# Patient Record
Sex: Female | Born: 1935 | Race: White | Hispanic: No | Marital: Married | Smoking: Former smoker
Health system: Southern US, Community
[De-identification: ages and names within clinical notes are randomized; demographics above are authoritative.]

## PROBLEM LIST (undated history)

## (undated) DIAGNOSIS — D539 Nutritional anemia, unspecified: Secondary | ICD-10-CM

## (undated) DIAGNOSIS — E7800 Pure hypercholesterolemia, unspecified: Secondary | ICD-10-CM

## (undated) DIAGNOSIS — I129 Hypertensive chronic kidney disease with stage 1 through stage 4 chronic kidney disease, or unspecified chronic kidney disease: Secondary | ICD-10-CM

## (undated) DIAGNOSIS — E538 Deficiency of other specified B group vitamins: Secondary | ICD-10-CM

## (undated) DIAGNOSIS — N183 Chronic kidney disease, stage 3 unspecified: Secondary | ICD-10-CM

## (undated) DIAGNOSIS — E46 Unspecified protein-calorie malnutrition: Secondary | ICD-10-CM

## (undated) DIAGNOSIS — M810 Age-related osteoporosis without current pathological fracture: Secondary | ICD-10-CM

## (undated) DIAGNOSIS — G479 Sleep disorder, unspecified: Secondary | ICD-10-CM

## (undated) HISTORY — DX: Unspecified protein-calorie malnutrition: E46

## (undated) HISTORY — DX: Pure hypercholesterolemia, unspecified: E78.00

## (undated) HISTORY — DX: Sleep disorder, unspecified: G47.9

## (undated) HISTORY — DX: Hypertensive chronic kidney disease with stage 1 through stage 4 chronic kidney disease, or unspecified chronic kidney disease: I12.9

## (undated) HISTORY — DX: Deficiency of other specified B group vitamins: E53.8

## (undated) HISTORY — DX: Nutritional anemia, unspecified: D53.9

## (undated) HISTORY — DX: Chronic kidney disease, stage 3 unspecified: N18.30

## (undated) HISTORY — DX: Age-related osteoporosis without current pathological fracture: M81.0

---

## 1999-04-25 ENCOUNTER — Encounter: Payer: Self-pay | Admitting: General Surgery

## 1999-04-30 ENCOUNTER — Ambulatory Visit (HOSPITAL_COMMUNITY): Admission: RE | Admit: 1999-04-30 | Discharge: 1999-04-30 | Payer: Self-pay | Admitting: General Surgery

## 1999-04-30 ENCOUNTER — Encounter (INDEPENDENT_AMBULATORY_CARE_PROVIDER_SITE_OTHER): Payer: Self-pay

## 1999-10-24 ENCOUNTER — Encounter: Admission: RE | Admit: 1999-10-24 | Discharge: 1999-10-24 | Payer: Self-pay | Admitting: Family Medicine

## 1999-10-24 ENCOUNTER — Encounter: Payer: Self-pay | Admitting: Family Medicine

## 2000-10-29 ENCOUNTER — Encounter: Admission: RE | Admit: 2000-10-29 | Discharge: 2000-10-29 | Payer: Self-pay | Admitting: Family Medicine

## 2000-10-29 ENCOUNTER — Encounter: Payer: Self-pay | Admitting: Family Medicine

## 2001-01-20 ENCOUNTER — Encounter: Admission: RE | Admit: 2001-01-20 | Discharge: 2001-01-20 | Payer: Self-pay | Admitting: Family Medicine

## 2001-01-20 ENCOUNTER — Encounter: Payer: Self-pay | Admitting: Family Medicine

## 2003-03-21 ENCOUNTER — Encounter: Admission: RE | Admit: 2003-03-21 | Discharge: 2003-03-21 | Payer: Self-pay | Admitting: Family Medicine

## 2005-10-24 ENCOUNTER — Encounter: Admission: RE | Admit: 2005-10-24 | Discharge: 2005-10-24 | Payer: Self-pay | Admitting: Family Medicine

## 2006-10-30 ENCOUNTER — Encounter: Admission: RE | Admit: 2006-10-30 | Discharge: 2006-10-30 | Payer: Self-pay | Admitting: Family Medicine

## 2007-12-14 ENCOUNTER — Encounter: Admission: RE | Admit: 2007-12-14 | Discharge: 2007-12-14 | Payer: Self-pay | Admitting: Family Medicine

## 2007-12-22 ENCOUNTER — Encounter: Admission: RE | Admit: 2007-12-22 | Discharge: 2007-12-22 | Payer: Self-pay | Admitting: Family Medicine

## 2009-12-24 ENCOUNTER — Encounter: Admission: RE | Admit: 2009-12-24 | Discharge: 2009-12-24 | Payer: Self-pay | Admitting: Family Medicine

## 2011-06-30 ENCOUNTER — Other Ambulatory Visit: Payer: Self-pay | Admitting: Family Medicine

## 2011-06-30 DIAGNOSIS — Z1231 Encounter for screening mammogram for malignant neoplasm of breast: Secondary | ICD-10-CM

## 2011-07-03 ENCOUNTER — Ambulatory Visit
Admission: RE | Admit: 2011-07-03 | Discharge: 2011-07-03 | Disposition: A | Discharge status: Home / Self Care | Payer: Medicare Other | Source: Ambulatory Visit | Attending: Family Medicine | Admitting: Family Medicine

## 2011-07-03 DIAGNOSIS — Z1231 Encounter for screening mammogram for malignant neoplasm of breast: Secondary | ICD-10-CM

## 2012-08-25 ENCOUNTER — Other Ambulatory Visit: Payer: Self-pay

## 2012-08-25 DIAGNOSIS — Z1231 Encounter for screening mammogram for malignant neoplasm of breast: Secondary | ICD-10-CM

## 2012-09-20 ENCOUNTER — Ambulatory Visit
Admission: RE | Admit: 2012-09-20 | Discharge: 2012-09-20 | Disposition: A | Discharge status: Home / Self Care | Payer: Medicare Other | Source: Ambulatory Visit

## 2012-09-20 DIAGNOSIS — Z1231 Encounter for screening mammogram for malignant neoplasm of breast: Secondary | ICD-10-CM

## 2014-07-28 ENCOUNTER — Other Ambulatory Visit: Payer: Self-pay

## 2014-07-28 DIAGNOSIS — Z1231 Encounter for screening mammogram for malignant neoplasm of breast: Secondary | ICD-10-CM

## 2014-08-08 ENCOUNTER — Ambulatory Visit: Payer: Self-pay

## 2015-08-01 ENCOUNTER — Ambulatory Visit
Admission: RE | Admit: 2015-08-01 | Discharge: 2015-08-01 | Disposition: A | Discharge status: Home / Self Care | Payer: Medicare Other | Source: Ambulatory Visit

## 2015-08-01 DIAGNOSIS — Z1231 Encounter for screening mammogram for malignant neoplasm of breast: Secondary | ICD-10-CM

## 2016-06-23 ENCOUNTER — Other Ambulatory Visit: Payer: Self-pay | Admitting: Family Medicine

## 2016-06-23 DIAGNOSIS — Z1231 Encounter for screening mammogram for malignant neoplasm of breast: Secondary | ICD-10-CM

## 2016-08-01 ENCOUNTER — Ambulatory Visit
Admission: RE | Admit: 2016-08-01 | Discharge: 2016-08-01 | Disposition: A | Discharge status: Home / Self Care | Payer: Medicare Other | Source: Ambulatory Visit | Attending: Family Medicine | Admitting: Family Medicine

## 2016-08-01 DIAGNOSIS — Z1231 Encounter for screening mammogram for malignant neoplasm of breast: Secondary | ICD-10-CM

## 2016-08-04 ENCOUNTER — Other Ambulatory Visit: Payer: Self-pay | Admitting: Family Medicine

## 2016-08-04 DIAGNOSIS — R928 Other abnormal and inconclusive findings on diagnostic imaging of breast: Secondary | ICD-10-CM

## 2016-08-06 ENCOUNTER — Ambulatory Visit
Admission: RE | Admit: 2016-08-06 | Discharge: 2016-08-06 | Disposition: A | Discharge status: Home / Self Care | Payer: Medicare Other | Source: Ambulatory Visit | Attending: Family Medicine | Admitting: Family Medicine

## 2016-08-06 DIAGNOSIS — R928 Other abnormal and inconclusive findings on diagnostic imaging of breast: Secondary | ICD-10-CM

## 2017-09-11 ENCOUNTER — Other Ambulatory Visit: Payer: Self-pay | Admitting: Family Medicine

## 2017-09-11 DIAGNOSIS — Z1231 Encounter for screening mammogram for malignant neoplasm of breast: Secondary | ICD-10-CM

## 2017-10-07 ENCOUNTER — Ambulatory Visit
Admission: RE | Admit: 2017-10-07 | Discharge: 2017-10-07 | Disposition: A | Discharge status: Home / Self Care | Payer: Medicare Other | Source: Ambulatory Visit | Attending: Family Medicine | Admitting: Family Medicine

## 2017-10-07 DIAGNOSIS — Z1231 Encounter for screening mammogram for malignant neoplasm of breast: Secondary | ICD-10-CM

## 2018-08-30 ENCOUNTER — Other Ambulatory Visit: Payer: Self-pay | Admitting: Family Medicine

## 2018-08-30 DIAGNOSIS — Z1231 Encounter for screening mammogram for malignant neoplasm of breast: Secondary | ICD-10-CM

## 2018-10-15 ENCOUNTER — Ambulatory Visit: Payer: Medicare Other

## 2019-01-12 IMAGING — MG DIGITAL SCREENING BILATERAL MAMMOGRAM WITH CAD
4 series · 4 of 4 positions shown · non-contrast
Comparison: Previous exam(s).

CLINICAL DATA: Screening.

EXAM:
DIGITAL SCREENING BILATERAL MAMMOGRAM WITH CAD

[R MLO]
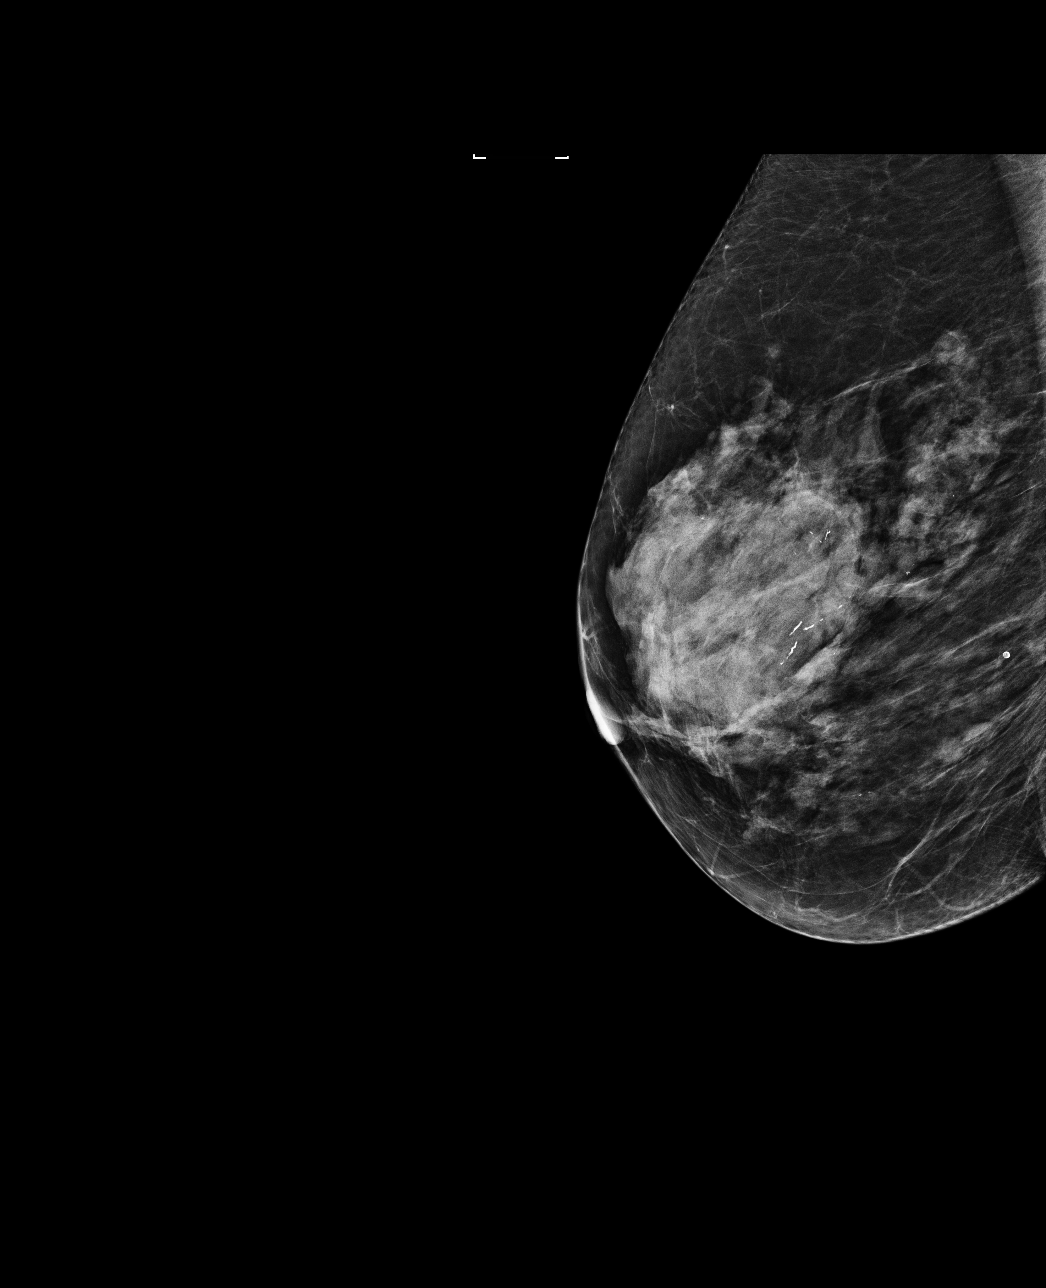

[L CC]
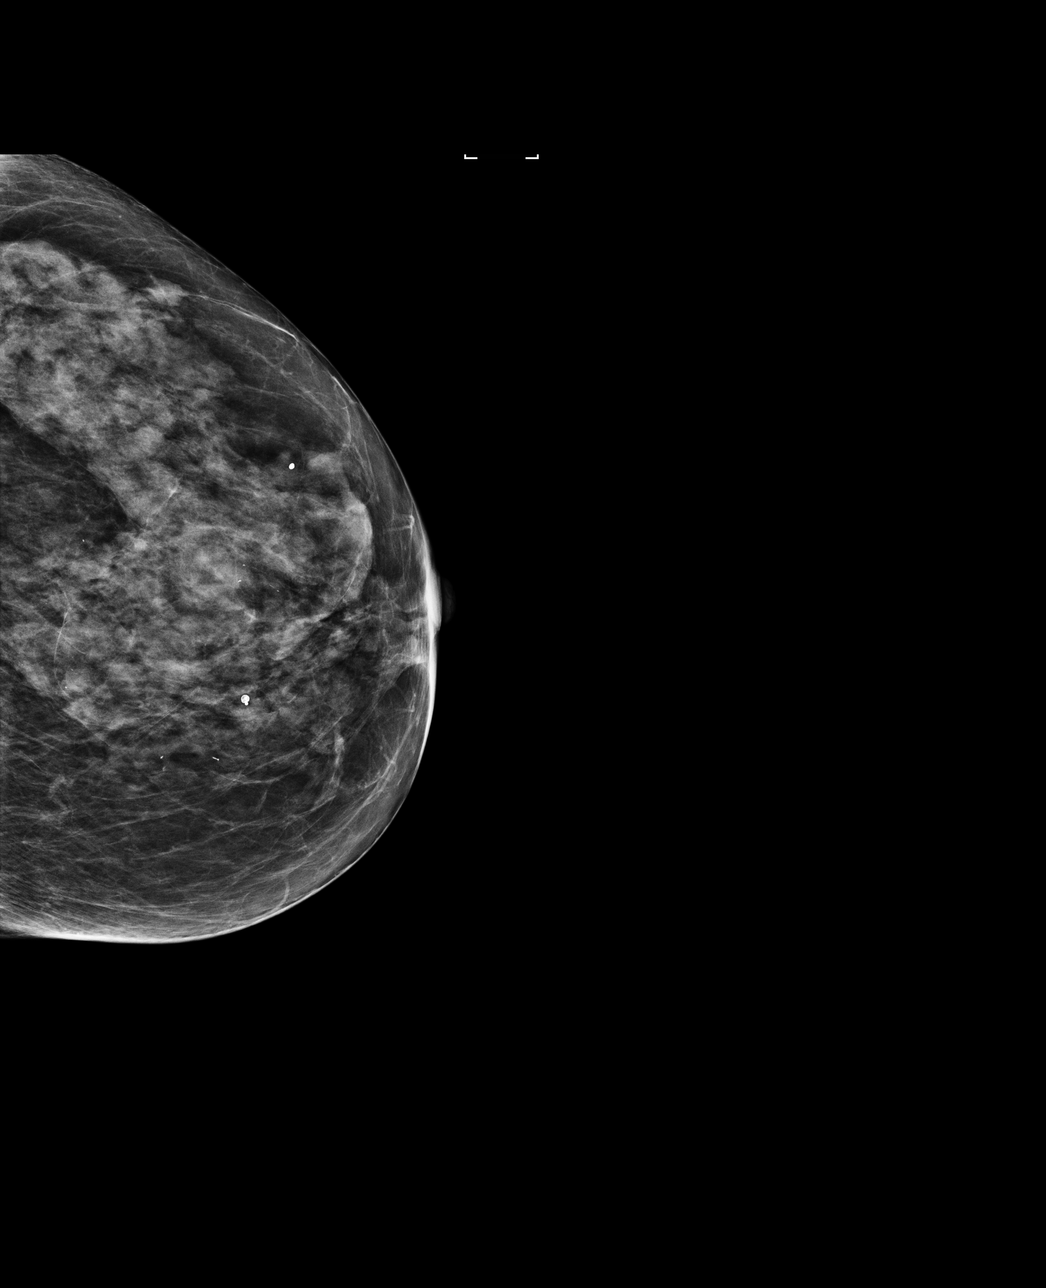

[L MLO]
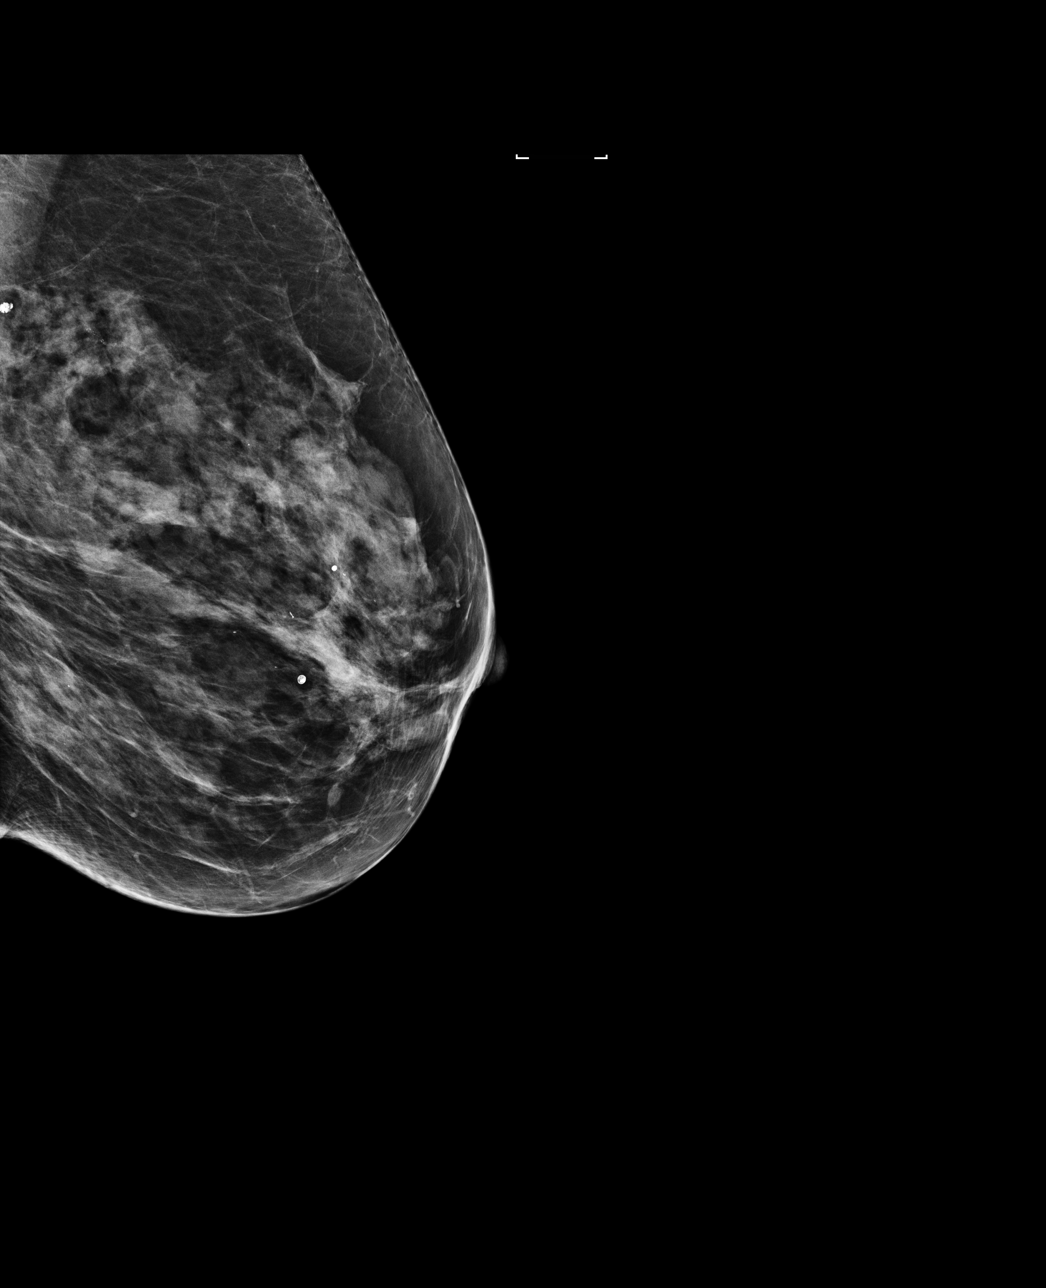

[R CC]
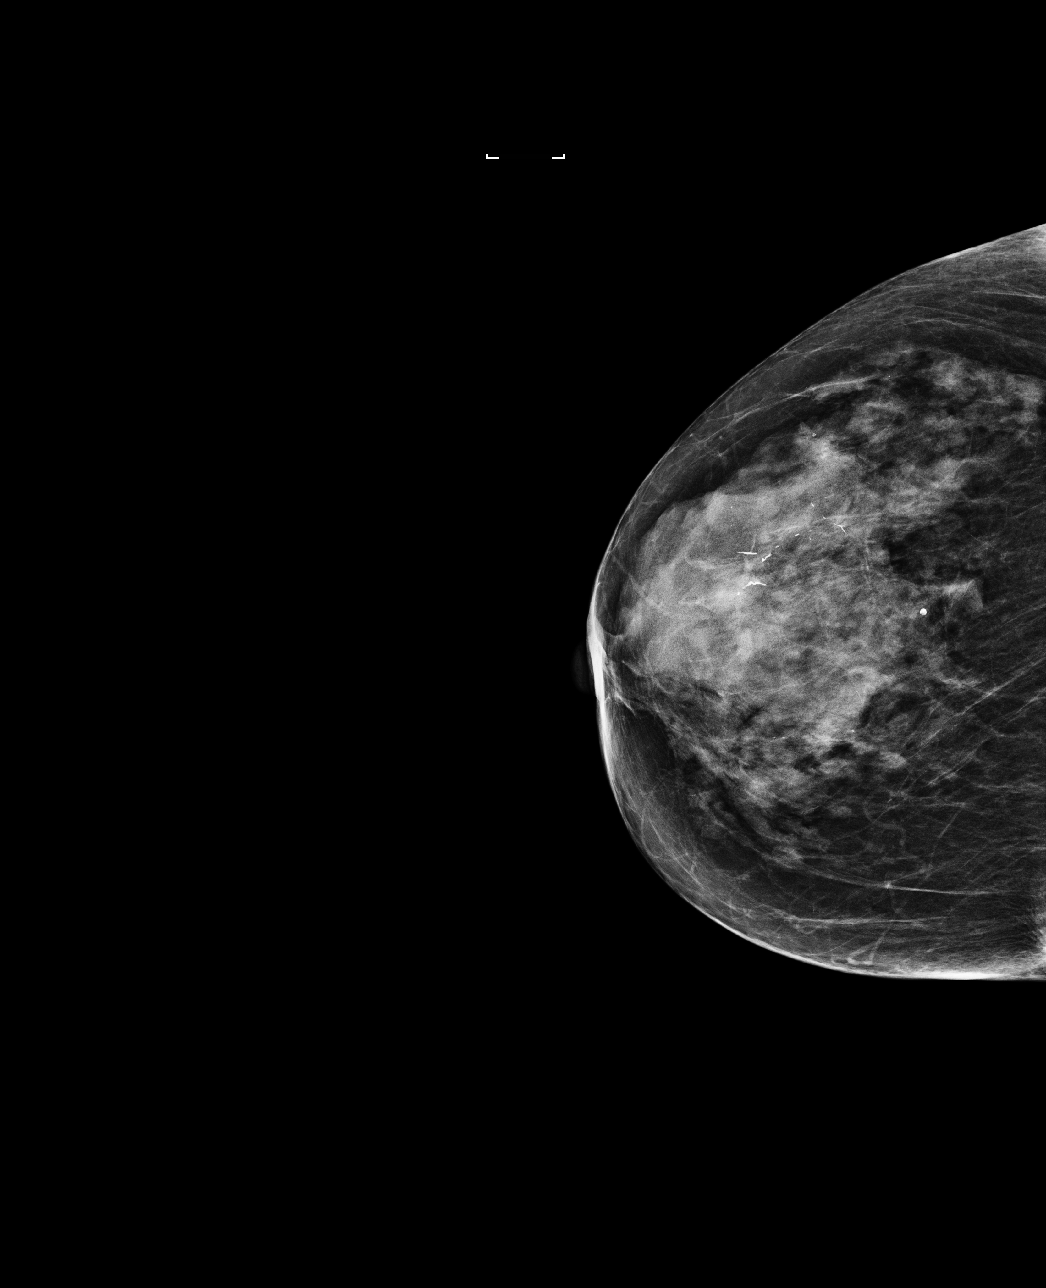

[4 of 4 positions shown; findings below may reference images not displayed]

ACR Breast Density Category c: The breast tissue is heterogeneously
dense, which may obscure small masses.
FINDINGS: There are no findings suspicious for malignancy. Images were
processed with CAD.
IMPRESSION: No mammographic evidence of malignancy. A result letter of this
screening mammogram will be mailed directly to the patient.

RECOMMENDATION:
Screening mammogram in one year. (Code:[id number])

BI-RADS CATEGORY  1: Negative.

## 2020-05-18 DIAGNOSIS — H35373 Puckering of macula, bilateral: Secondary | ICD-10-CM | POA: Diagnosis not present

## 2020-05-18 DIAGNOSIS — H26492 Other secondary cataract, left eye: Secondary | ICD-10-CM | POA: Diagnosis not present

## 2020-05-18 DIAGNOSIS — H524 Presbyopia: Secondary | ICD-10-CM | POA: Diagnosis not present

## 2020-09-20 ENCOUNTER — Other Ambulatory Visit (HOSPITAL_BASED_OUTPATIENT_CLINIC_OR_DEPARTMENT_OTHER): Payer: Self-pay

## 2020-10-31 DIAGNOSIS — E46 Unspecified protein-calorie malnutrition: Secondary | ICD-10-CM | POA: Diagnosis not present

## 2020-10-31 DIAGNOSIS — E538 Deficiency of other specified B group vitamins: Secondary | ICD-10-CM | POA: Diagnosis not present

## 2020-10-31 DIAGNOSIS — E7800 Pure hypercholesterolemia, unspecified: Secondary | ICD-10-CM | POA: Diagnosis not present

## 2020-10-31 DIAGNOSIS — G479 Sleep disorder, unspecified: Secondary | ICD-10-CM | POA: Diagnosis not present

## 2020-10-31 DIAGNOSIS — Z0000 Encounter for general adult medical examination without abnormal findings: Secondary | ICD-10-CM | POA: Diagnosis not present

## 2020-10-31 DIAGNOSIS — M810 Age-related osteoporosis without current pathological fracture: Secondary | ICD-10-CM | POA: Diagnosis not present

## 2020-10-31 DIAGNOSIS — I129 Hypertensive chronic kidney disease with stage 1 through stage 4 chronic kidney disease, or unspecified chronic kidney disease: Secondary | ICD-10-CM | POA: Diagnosis not present

## 2020-10-31 DIAGNOSIS — Z1389 Encounter for screening for other disorder: Secondary | ICD-10-CM | POA: Diagnosis not present

## 2020-10-31 DIAGNOSIS — D539 Nutritional anemia, unspecified: Secondary | ICD-10-CM | POA: Diagnosis not present

## 2020-10-31 DIAGNOSIS — Z23 Encounter for immunization: Secondary | ICD-10-CM | POA: Diagnosis not present

## 2021-03-27 ENCOUNTER — Non-Acute Institutional Stay: Payer: Medicare Other | Admitting: Internal Medicine

## 2021-03-27 ENCOUNTER — Encounter: Payer: Self-pay | Admitting: Internal Medicine

## 2021-03-27 ENCOUNTER — Other Ambulatory Visit: Payer: Self-pay

## 2021-03-27 VITALS — BP 156/80 | HR 60 | Temp 97.7°F | Ht 63.0 in | Wt 91.8 lb

## 2021-03-27 DIAGNOSIS — J310 Chronic rhinitis: Secondary | ICD-10-CM | POA: Diagnosis not present

## 2021-03-27 DIAGNOSIS — I10 Essential (primary) hypertension: Secondary | ICD-10-CM

## 2021-03-27 DIAGNOSIS — E538 Deficiency of other specified B group vitamins: Secondary | ICD-10-CM

## 2021-03-27 DIAGNOSIS — F5101 Primary insomnia: Secondary | ICD-10-CM | POA: Diagnosis not present

## 2021-03-27 MED ORDER — IPRATROPIUM BROMIDE 0.03 % NA SOLN: 2.0000 | Freq: Every day | NASAL | 3 refills | Status: DC

## 2021-03-27 NOTE — Progress Notes (Signed)
Location:  [place] of Service:  Clinic (12)  Provider:   Code Status:  Goals of Care:  Advanced Directives 03/27/2021  Does Patient Have a Medical Advance Directive? Yes  Type of Advance Directive Living will  Does patient want to make changes to medical advance directive? No - Patient declined     Chief Complaint  Patient presents with   Medical Management of Chronic Issues    Patient here today to establish care    HPI: Patient is a 88 y.o. female seen today for medical management of chronic diseases.   H/o Hypertension, Insomnia, Chronic Rhinitis  Very active Husband who is 35 years old just moved to SNF She continues to walk and drive with no issues Had no complain today  History reviewed. No pertinent past medical history.  History reviewed. No pertinent surgical history.  Allergies  Allergen Reactions   Sulfa Antibiotics     Other reaction(s): vomiting/nausea    Outpatient Encounter Medications as of 03/27/2021  Medication Sig   benazepril-hydrochlorthiazide (LOTENSIN HCT) 20-12.5 MG tablet Take 1 tablet by mouth daily.   Cholecalciferol (VITAMIN D3) 100000 UNIT/GM POWD 1 capsule   Glucosamine-Chondroitin (GLUCOSAMINE CHONDR COMPLEX PO)    temazepam (RESTORIL) 30 MG capsule Take 30 mg by mouth at bedtime.   vitamin B-12 (CYANOCOBALAMIN) 100 MCG tablet See admin instructions.   vitamin C (ASCORBIC ACID) 500 MG tablet Take 500 mg by mouth daily.   [DISCONTINUED] ipratropium (ATROVENT) 0.03 % nasal spray Place 2 sprays into both nostrils daily.   ipratropium (ATROVENT) 0.03 % nasal spray Place 2 sprays into both nostrils daily.   No facility-administered encounter medications on file as of 03/27/2021.    Review of Systems:  Review of Systems  Constitutional:  Negative for activity change and appetite change.  HENT: Negative.    Respiratory:  Negative for cough and shortness of breath.   Cardiovascular:  Negative for leg  swelling.  Gastrointestinal:  Negative for constipation.  Genitourinary: Negative.   Musculoskeletal:  Negative for arthralgias, gait problem and myalgias.  Skin: Negative.   Neurological:  Negative for dizziness and weakness.  Psychiatric/Behavioral:  Positive for sleep disturbance. Negative for confusion and dysphoric mood.    Health Maintenance  Topic Date Due   Zoster Vaccines- Shingrix (1 of 2) Never done   COVID-19 Vaccine (4 - Booster for Moderna series) 02/21/2020   TETANUS/TDAP  06/29/2021   Pneumonia Vaccine 65+ Years old  Completed   INFLUENZA VACCINE  Completed   DEXA SCAN  Completed   HPV VACCINES  Aged Out    Physical Exam: Vitals:   03/27/21 0926  BP: (!) 156/80  Pulse: 60  Temp: 97.7 F (36.5 C)  SpO2: 99%  Weight: 91 lb 12.8 oz (41.6 kg)  Height: 5\' 3"  (1.6 m)   Body mass index is 16.26 kg/m. Physical Exam Vitals reviewed.  Constitutional:      Appearance: Normal appearance.  HENT:     Head: Normocephalic.     Right Ear: Tympanic membrane normal.     Left Ear: Tympanic membrane normal.     Nose: Nose normal.     Mouth/Throat:     Mouth: Mucous membranes are moist.     Pharynx: Oropharynx is clear.  Eyes:     Pupils: Pupils are equal, round, and reactive to light.  Cardiovascular:     Rate and Rhythm: Normal rate and regular rhythm.     Pulses: Normal pulses.  Heart sounds: Normal heart sounds. No murmur heard. Pulmonary:     Effort: Pulmonary effort is normal.     Breath sounds: Normal breath sounds.  Abdominal:     General: Abdomen is flat. Bowel sounds are normal.     Palpations: Abdomen is soft.  Musculoskeletal:        General: No swelling.     Cervical back: Neck supple.  Skin:    General: Skin is warm.  Neurological:     General: No focal deficit present.     Mental Status: She is alert and oriented to person, place, and time.  Psychiatric:        Mood and Affect: Mood normal.        Thought Content: Thought content normal.     Labs reviewed: Basic Metabolic Panel: No results for input(s): NA, K, CL, CO2, GLUCOSE, BUN, CREATININE, CALCIUM, MG, PHOS, TSH in the last 8760 hours. Liver Function Tests: No results for input(s): AST, ALT, ALKPHOS, BILITOT, PROT, ALBUMIN in the last 8760 hours. No results for input(s): LIPASE, AMYLASE in the last 8760 hours. No results for input(s): AMMONIA in the last 8760 hours. CBC: No results for input(s): WBC, NEUTROABS, HGB, HCT, MCV, PLT in the last 8760 hours. Lipid Panel: No results for input(s): CHOL, HDL, LDLCALC, TRIG, CHOLHDL, LDLDIRECT in the last 8760 hours. No results found for: HGBA1C  Procedures since last visit: No results found.  Assessment/Plan 1. Primary hypertension BP slightly high  Continue same dose of Lotensin HCTZ  2. Primary insomnia On Restoril for many years Discussed the side effects   3. Chronic rhinitis Renewed Atrovent Spray  4. Vitamin B 12 deficiency Continue Supplement 5 Discussed the need for DEXA She is not intrested at this time Continue calcium and Vit d She will also bring her Advance directives next visit   Labs/tests ordered:  TSH,CBC,CMP Vit D level B 12 level before next visit Next appt:  Visit date not found

## 2021-04-02 DIAGNOSIS — F5101 Primary insomnia: Secondary | ICD-10-CM | POA: Diagnosis not present

## 2021-04-02 DIAGNOSIS — E559 Vitamin D deficiency, unspecified: Secondary | ICD-10-CM | POA: Diagnosis not present

## 2021-04-02 DIAGNOSIS — I10 Essential (primary) hypertension: Secondary | ICD-10-CM | POA: Diagnosis not present

## 2021-04-02 DIAGNOSIS — E538 Deficiency of other specified B group vitamins: Secondary | ICD-10-CM | POA: Diagnosis not present

## 2021-04-02 LAB — TSH: TSH: 3.98 (ref 0.41–5.90)

## 2021-04-02 LAB — BASIC METABOLIC PANEL
BUN: 29 — AB (ref 4–21)
Creatinine: 1 (ref 0.5–1.1)
Glucose: 91
Sodium: 148 — AB (ref 137–147)

## 2021-04-02 LAB — COMPREHENSIVE METABOLIC PANEL
Albumin: 3.8 (ref 3.5–5.0)
Globulin: 2.2

## 2021-04-02 LAB — CBC AND DIFFERENTIAL
HCT: 35 — AB (ref 36–46)
Hemoglobin: 11.2 — AB (ref 12.0–16.0)
Platelets: 202 10*3/uL (ref 150–400)

## 2021-04-02 LAB — VITAMIN B12: Vitamin B-12: 866

## 2021-04-02 LAB — CBC: RBC: 3.86 — AB (ref 3.87–5.11)

## 2021-04-08 ENCOUNTER — Other Ambulatory Visit: Payer: Self-pay

## 2021-04-08 MED ORDER — TEMAZEPAM 30 MG PO CAPS: 30.0000 mg | ORAL_CAPSULE | Freq: Every day | ORAL | 0 refills | Status: DC

## 2021-05-24 DIAGNOSIS — H524 Presbyopia: Secondary | ICD-10-CM | POA: Diagnosis not present

## 2021-05-24 DIAGNOSIS — H04123 Dry eye syndrome of bilateral lacrimal glands: Secondary | ICD-10-CM | POA: Diagnosis not present

## 2021-06-04 ENCOUNTER — Telehealth: Payer: Self-pay

## 2021-06-04 MED ORDER — NIRMATRELVIR/RITONAVIR (PAXLOVID)TABLET: 3.0000 | ORAL_TABLET | Freq: Two times a day (BID) | ORAL | 0 refills | Status: DC

## 2021-06-04 MED ORDER — NIRMATRELVIR/RITONAVIR (PAXLOVID) TABLET (RENAL DOSING): 2.0000 | ORAL_TABLET | Freq: Two times a day (BID) | ORAL | 0 refills | Status: AC

## 2021-06-04 NOTE — Telephone Encounter (Signed)
I ordered Paxlovid for her.

## 2021-06-04 NOTE — Telephone Encounter (Signed)
Notified Autumn ?

## 2021-06-04 NOTE — Telephone Encounter (Addendum)
Erika Dean states the patient was on her 5th day of isolation and requested to be tested. She tested positive. Symptoms: Watery eye and cough.  ? ?Erika Dean requesting Paxlovid order.  ?Labs abstracted. ?

## 2021-08-07 ENCOUNTER — Other Ambulatory Visit: Payer: Self-pay

## 2021-08-07 MED ORDER — TEMAZEPAM 30 MG PO CAPS: 30.0000 mg | ORAL_CAPSULE | Freq: Every day | ORAL | 5 refills | Status: DC

## 2021-08-07 NOTE — Telephone Encounter (Signed)
Patients pharmacy called requesting a refill for temazepam (controlled substance)  RX last refilled in Feb 2023, no treatment agreement on file. Notation made on pending appointment for August to sign treatment agreement.

## 2021-08-08 ENCOUNTER — Other Ambulatory Visit: Payer: Self-pay | Admitting: Internal Medicine

## 2021-08-08 NOTE — Telephone Encounter (Signed)
Patient needs refill on blood pressure medication Benazepril. Patient medication added to list December 2022. Medication not filled by PCP Mahlon Gammon, MD . Medication pend and sent to provider for approval.

## 2021-09-23 ENCOUNTER — Non-Acute Institutional Stay: Payer: Medicare Other | Admitting: Adult Health

## 2021-09-23 ENCOUNTER — Encounter: Payer: Self-pay | Admitting: Adult Health

## 2021-09-23 VITALS — BP 144/80 | HR 94 | Temp 98.6°F | Ht 63.0 in | Wt 95.8 lb

## 2021-09-23 DIAGNOSIS — J310 Chronic rhinitis: Secondary | ICD-10-CM | POA: Diagnosis not present

## 2021-09-23 DIAGNOSIS — R600 Localized edema: Secondary | ICD-10-CM

## 2021-09-23 DIAGNOSIS — I10 Essential (primary) hypertension: Secondary | ICD-10-CM | POA: Diagnosis not present

## 2021-09-23 DIAGNOSIS — M810 Age-related osteoporosis without current pathological fracture: Secondary | ICD-10-CM

## 2021-09-23 DIAGNOSIS — D539 Nutritional anemia, unspecified: Secondary | ICD-10-CM | POA: Insufficient documentation

## 2021-09-23 DIAGNOSIS — N1832 Chronic kidney disease, stage 3b: Secondary | ICD-10-CM

## 2021-09-23 DIAGNOSIS — F5101 Primary insomnia: Secondary | ICD-10-CM | POA: Diagnosis not present

## 2021-09-23 DIAGNOSIS — E538 Deficiency of other specified B group vitamins: Secondary | ICD-10-CM

## 2021-09-23 NOTE — Progress Notes (Signed)
Location:  [facility]  POS: Clinic  Provider: Fletcher Anon, ANP  Code Status:  Goals of Care:    03/27/2021    9:16 AM  Advanced Directives  Does Patient Have a Medical Advance Directive? Yes  Type of Advance Directive Living will  Does patient want to make changes to medical advance directive? No - Patient declined     Chief Complaint  Patient presents with   Medication Management    Patient returns to the clinic for her 6 month follow up and sign medication agreement.     HPI: Patient is a 88 y.o. female seen today for medical management of chronic diseases.    PMH  HTN, insomnia, chronic rhinitis, CKD, HLD,  macrocytic anemia, B12 def  Husband lives in skilled care  Hx of osteoporosis no recent DEXA. Has declined additional testing in the past, now interested.   Mammogram last normal 2019 no longer gets them due to her age.   Does not drink enough water she reports. Appetite is fair  Sleeping well on temazepam    Past Medical History:  Diagnosis Date   Age-related osteoporosis without current pathological fracture    B12 deficiency    Chronic kidney disease, stage 3 (HCC)    Hypercholesteremia    Hypertension with renal disease    Macrocytic anemia    Malnutrition, calorie (HCC)    Sleep disorder     History reviewed. No pertinent surgical history.  Allergies  Allergen Reactions   Sulfa Antibiotics     Other reaction(s): vomiting/nausea    Outpatient Encounter Medications as of 09/23/2021  Medication Sig   benazepril-hydrochlorthiazide (LOTENSIN HCT) 20-12.5 MG tablet TAKE ONE TABLET EACH DAY   Cholecalciferol (VITAMIN D3) 100000 UNIT/GM POWD 1 capsule   Glucosamine-Chondroitin (GLUCOSAMINE CHONDR COMPLEX PO)    ipratropium (ATROVENT) 0.03 % nasal spray Place 2 sprays into both nostrils daily.   temazepam (RESTORIL) 30 MG capsule Take 1 capsule (30 mg total) by mouth at bedtime.   vitamin B-12 (CYANOCOBALAMIN) 100 MCG tablet See admin  instructions.   vitamin C (ASCORBIC ACID) 500 MG tablet Take 500 mg by mouth daily.   No facility-administered encounter medications on file as of 09/23/2021.    Review of Systems:  Review of Systems  Constitutional:  Negative for activity change, appetite change, chills, diaphoresis, fatigue, fever and unexpected weight change.  HENT:  Negative for congestion.   Respiratory:  Negative for cough, shortness of breath and wheezing.   Cardiovascular:  Positive for leg swelling. Negative for chest pain and palpitations.  Gastrointestinal:  Negative for abdominal distention, abdominal pain, constipation and diarrhea.  Genitourinary:  Negative for difficulty urinating and dysuria.  Musculoskeletal:  Negative for arthralgias, back pain, gait problem, joint swelling and myalgias.  Neurological:  Negative for dizziness, tremors, seizures, syncope, facial asymmetry, speech difficulty, weakness, light-headedness, numbness and headaches.  Psychiatric/Behavioral:  Negative for agitation, behavioral problems and confusion.     Health Maintenance  Topic Date Due   Zoster Vaccines- Shingrix (1 of 2) Never done   COVID-19 Vaccine (4 - Moderna series) 02/21/2020   TETANUS/TDAP  06/29/2021   INFLUENZA VACCINE  09/10/2021   Pneumonia Vaccine 60+ Years old  Completed   DEXA SCAN  Completed   HPV VACCINES  Aged Out    Physical Exam: Vitals:   09/23/21 1407 09/23/21 1445  BP: (!) 168/88 (!) 144/80  Pulse: 94   Temp: 98.6 F (37 C)   SpO2: 92%   Weight: 95 lb  12.8 oz (43.5 kg)   Height: 5\' 3"  (1.6 m)    Body mass index is 16.97 kg/m. Physical Exam Vitals and nursing note reviewed.  Constitutional:      General: She is not in acute distress.    Appearance: She is not diaphoretic.  HENT:     Head: Normocephalic and atraumatic.     Right Ear: Tympanic membrane and ear canal normal.     Left Ear: Tympanic membrane and ear canal normal.     Nose: Nose normal.     Mouth/Throat:     Mouth:  Mucous membranes are moist.     Pharynx: Oropharynx is clear.  Eyes:     Conjunctiva/sclera: Conjunctivae normal.     Pupils: Pupils are equal, round, and reactive to light.  Neck:     Vascular: No JVD.  Cardiovascular:     Rate and Rhythm: Normal rate and regular rhythm.     Heart sounds: No murmur heard. Pulmonary:     Effort: Pulmonary effort is normal. No respiratory distress.     Breath sounds: Normal breath sounds. No wheezing.  Abdominal:     General: Bowel sounds are normal. There is no distension.     Palpations: Abdomen is soft.     Tenderness: There is no abdominal tenderness.  Musculoskeletal:     Comments: BLE edema +1 with varicose veins.   Skin:    General: Skin is warm and dry.  Neurological:     Mental Status: She is alert and oriented to person, place, and time.  Psychiatric:        Mood and Affect: Mood normal.     Labs reviewed: Basic Metabolic Panel: Recent Labs    04/02/21 0000  NA 148*  K 4.5  CL 110*  CO2 26*  BUN 29*  CREATININE 1.0  CALCIUM 9.5  TSH 3.98   Liver Function Tests: Recent Labs    04/02/21 0000  AST 14  ALT 12  ALKPHOS 92  ALBUMIN 3.8   No results for input(s): "LIPASE", "AMYLASE" in the last 8760 hours. No results for input(s): "AMMONIA" in the last 8760 hours. CBC: Recent Labs    04/02/21 0000  WBC 4.2  HGB 11.2*  HCT 35*  PLT 202   Lipid Panel: No results for input(s): "CHOL", "HDL", "LDLCALC", "TRIG", "CHOLHDL", "LDLDIRECT" in the last 8760 hours. No results found for: "HGBA1C"  Procedures since last visit: No results found.  Assessment/Plan   1. Age-related osteoporosis without current pathological fracture  - DG Bone Density; Future  2. Primary hypertension Recheck at goal <150/90 Continue current meds, may need to adjust off diuretic if NA elevated.   3. Localized edema Recommend low salt diet Compression hose  4. Stage 3b chronic kidney disease (HCC) Continue to periodically monitor BMP  and avoid nephrotoxic agents Hydrate   5. Primary insomnia Continue temazepam  6. Chronic rhinitis Continue atrovent   7. Macrocytic anemia Lab Results  Component Value Date   HGB 11.2 (A) 04/02/2021   Lab Results  Component Value Date   VITAMINB12 866 04/02/2021    Labs/tests ordered:  * No order type specified * BMP CBC this week   Next appt: 6 months with Dr 04/04/2021    Total time Chales Abrahams:  time greater than 50% of total time spent doing pt counseling and coordination of care      Recommend tetanus vaccine and shingles.

## 2021-09-23 NOTE — Patient Instructions (Signed)
Recommend tetanus vaccine and shingles.

## 2021-09-26 DIAGNOSIS — N1832 Chronic kidney disease, stage 3b: Secondary | ICD-10-CM | POA: Diagnosis not present

## 2021-09-26 DIAGNOSIS — R600 Localized edema: Secondary | ICD-10-CM | POA: Diagnosis not present

## 2021-09-26 DIAGNOSIS — I10 Essential (primary) hypertension: Secondary | ICD-10-CM | POA: Diagnosis not present

## 2021-09-26 LAB — BASIC METABOLIC PANEL
BUN: 26 — AB (ref 4–21)
CO2: 21 (ref 13–22)
Chloride: 105 (ref 99–108)
Creatinine: 1.1 (ref 0.5–1.1)
Glucose: 98
Potassium: 4.7 mEq/L (ref 3.5–5.1)
Sodium: 142 (ref 137–147)

## 2021-09-26 LAB — COMPREHENSIVE METABOLIC PANEL
Calcium: 9.2 (ref 8.7–10.7)
eGFR: 49

## 2021-09-27 ENCOUNTER — Encounter: Payer: Self-pay | Admitting: Adult Health

## 2022-02-25 ENCOUNTER — Non-Acute Institutional Stay: Payer: Medicare Other | Admitting: Internal Medicine

## 2022-02-25 ENCOUNTER — Encounter: Payer: Self-pay | Admitting: Internal Medicine

## 2022-02-25 VITALS — BP 116/70 | HR 80 | Temp 97.8°F | Resp 18 | Ht 63.0 in | Wt 88.8 lb

## 2022-02-25 DIAGNOSIS — N1832 Chronic kidney disease, stage 3b: Secondary | ICD-10-CM | POA: Diagnosis not present

## 2022-02-25 DIAGNOSIS — J209 Acute bronchitis, unspecified: Secondary | ICD-10-CM

## 2022-02-25 MED ORDER — PREDNISONE 20 MG PO TABS: 20.0000 mg | ORAL_TABLET | Freq: Every day | ORAL | 0 refills | Status: DC

## 2022-02-25 NOTE — Patient Instructions (Addendum)
Stop Aleve at night Take Tylenol if needed Will Order Xray through [facility] Can take Mucinex 1-2 tablet a day Prednisone one tablet a day with Breakfast

## 2022-02-26 ENCOUNTER — Other Ambulatory Visit: Payer: Self-pay | Admitting: Orthopedic Surgery

## 2022-02-26 DIAGNOSIS — J90 Pleural effusion, not elsewhere classified: Secondary | ICD-10-CM

## 2022-02-26 DIAGNOSIS — J189 Pneumonia, unspecified organism: Secondary | ICD-10-CM

## 2022-02-26 MED ORDER — DOXYCYCLINE HYCLATE 100 MG PO TABS: 100.0000 mg | ORAL_TABLET | Freq: Two times a day (BID) | ORAL | 0 refills | Status: AC

## 2022-02-26 MED ORDER — FUROSEMIDE 20 MG PO TABS: 20.0000 mg | ORAL_TABLET | Freq: Every morning | ORAL | 0 refills | Status: DC

## 2022-02-26 NOTE — Progress Notes (Signed)
CXR reveals pulmonary infiltrate to left lower lung base and mild left pleural effusion. [facility] nurse contacted with directions. Prescription for doxycycline and furosemide sent to pharmacy.

## 2022-02-26 NOTE — Progress Notes (Signed)
Location: [place] of Service:  Clinic (12)  Provider:   Code Status:  Goals of Care:     02/25/2022    1:09 PM  Advanced Directives  Does Patient Have a Medical Advance Directive? Yes  Type of Paramedic of [place];Living will;Out of facility DNR (pink MOST or yellow form)  Does patient want to make changes to medical advance directive? No - Patient declined  Copy of [place] in Chart? No - copy requested     Chief Complaint  Patient presents with   Acute Visit    Patient states she is here for a cough    HPI: Patient is a 89 y.o. female seen today for an acute visit for Cough Runny nose   Patient lives in [place] in [place] with c/o Cough and Runny nose for past few days Was tested by [facility] nurse for Flu and Covid and was negative She denies fever Chest pain  or SOB She did take full box of Mucinex to help her cough Also was taking Flonase to help her Runny nose Coughing up mostly clear mucus sometimes it is thick  H/o Hypertension, Insomnia, Chronic Rhinitis  Recent loss of her husband who was 72 years old. Married for 33 years  Past Medical History:  Diagnosis Date   Age-related osteoporosis without current pathological fracture    B12 deficiency    Chronic kidney disease, stage 3 (HCC)    Hypercholesteremia    Hypertension with renal disease    Macrocytic anemia    Malnutrition, calorie ([place])    Sleep disorder     History reviewed. No pertinent surgical history.  Allergies  Allergen Reactions   Sulfa Antibiotics     Other reaction(s): vomiting/nausea    Outpatient Encounter Medications as of 02/25/2022  Medication Sig   benazepril-hydrochlorthiazide (LOTENSIN HCT) 20-12.5 MG tablet TAKE ONE TABLET EACH DAY   Cholecalciferol (VITAMIN D3) 100000 UNIT/GM POWD 1 capsule   Glucosamine-Chondroitin (GLUCOSAMINE CHONDR COMPLEX PO)    ipratropium (ATROVENT) 0.03 % nasal spray Place 2  sprays into both nostrils daily.   predniSONE (DELTASONE) 20 MG tablet Take 1 tablet (20 mg total) by mouth daily with breakfast.   temazepam (RESTORIL) 30 MG capsule Take 1 capsule (30 mg total) by mouth at bedtime.   vitamin B-12 (CYANOCOBALAMIN) 100 MCG tablet See admin instructions.   vitamin C (ASCORBIC ACID) 500 MG tablet Take 500 mg by mouth daily.   No facility-administered encounter medications on file as of 02/25/2022.    Review of Systems:  Review of Systems  Constitutional:  Positive for appetite change. Negative for activity change.  HENT:  Positive for postnasal drip and rhinorrhea.   Respiratory:  Positive for cough. Negative for shortness of breath.   Cardiovascular:  Negative for leg swelling.  Gastrointestinal:  Negative for constipation.  Genitourinary: Negative.   Musculoskeletal:  Negative for arthralgias, gait problem and myalgias.  Skin: Negative.   Neurological:  Negative for dizziness and weakness.  Psychiatric/Behavioral:  Negative for confusion, dysphoric mood and sleep disturbance.     Health Maintenance  Topic Date Due   DTaP/Tdap/Td (3 - Td or Tdap) 06/29/2021   Medicare Annual Wellness (AWV)  10/31/2021   COVID-19 Vaccine (5 - 2023-24 season) 03/13/2022 (Originally 02/05/2022)   Zoster Vaccines- Shingrix (1 of 2) 05/27/2022 (Originally 01/18/1977)   Pneumonia Vaccine 44+ Years old  Completed   INFLUENZA VACCINE  Completed   DEXA SCAN  Completed   HPV VACCINES  Aged Out    Physical Exam: Vitals:   02/25/22 1307  BP: 116/70  Pulse: 80  Resp: 18  Temp: 97.8 F (36.6 C)  TempSrc: Temporal  SpO2: 99%  Weight: 88 lb 12.8 oz (40.3 kg)  Height: 5\' 3"  (1.6 m)   Body mass index is 15.73 kg/m. Physical Exam Vitals reviewed.  Constitutional:      Appearance: Normal appearance.  HENT:     Head: Normocephalic.     Nose: Nose normal.     Mouth/Throat:     Mouth: Mucous membranes are moist.     Pharynx: Oropharynx is clear.  Eyes:     Pupils:  Pupils are equal, round, and reactive to light.  Cardiovascular:     Rate and Rhythm: Normal rate and regular rhythm.     Pulses: Normal pulses.     Heart sounds: Normal heart sounds. No murmur heard. Pulmonary:     Effort: Pulmonary effort is normal.     Comments: Had Bilateral Rales and Expiratory Wheezing Abdominal:     General: Abdomen is flat. Bowel sounds are normal.     Palpations: Abdomen is soft.  Musculoskeletal:        General: No swelling.     Cervical back: Neck supple.  Skin:    General: Skin is warm.  Neurological:     General: No focal deficit present.     Mental Status: She is alert and oriented to person, place, and time.  Psychiatric:        Mood and Affect: Mood normal.        Thought Content: Thought content normal.     Labs reviewed: Basic Metabolic Panel: Recent Labs    04/02/21 0000 09/26/21 0000  NA 148* 142  K 4.5 4.7  CL 110* 105  CO2 26* 21  BUN 29* 26*  CREATININE 1.0 1.1  CALCIUM 9.5 9.2  TSH 3.98  --    Liver Function Tests: Recent Labs    04/02/21 0000  AST 14  ALT 12  ALKPHOS 92  ALBUMIN 3.8   No results for input(s): "LIPASE", "AMYLASE" in the last 8760 hours. No results for input(s): "AMMONIA" in the last 8760 hours. CBC: Recent Labs    04/02/21 0000  WBC 4.2  HGB 11.2*  HCT 35*  PLT 202   Lipid Panel: No results for input(s): "CHOL", "HDL", "LDLCALC", "TRIG", "CHOLHDL", "LDLDIRECT" in the last 8760 hours. No results found for: "HGBA1C"  Procedures since last visit: No results found.  Assessment/Plan 1. Acute bronchitis, unspecified organism Chest Xray [facility]dered in [facility] Will Start her on Prednisone 20 mg QD for 7 days for [place] Can take Mucinex 1-2 times a day   2. Stage 3b chronic kidney disease ([place]) Today she told me that she has been taking aleve every night for many years Discontinue Aleve  Can take Tylenol as needed    Labs/tests ordered:  * No order type specified * Next appt:   04/02/2022

## 2022-03-04 ENCOUNTER — Other Ambulatory Visit: Payer: Self-pay | Admitting: Internal Medicine

## 2022-03-04 DIAGNOSIS — G479 Sleep disorder, unspecified: Secondary | ICD-10-CM

## 2022-04-01 ENCOUNTER — Encounter: Payer: Self-pay | Admitting: Internal Medicine

## 2022-04-01 ENCOUNTER — Other Ambulatory Visit: Payer: Self-pay | Admitting: Internal Medicine

## 2022-04-01 ENCOUNTER — Non-Acute Institutional Stay: Payer: Medicare Other | Admitting: Internal Medicine

## 2022-04-01 VITALS — BP 122/78 | HR 66 | Temp 97.9°F | Resp 16 | Ht 63.0 in | Wt 94.6 lb

## 2022-04-01 DIAGNOSIS — I7300 Raynaud's syndrome without gangrene: Secondary | ICD-10-CM

## 2022-04-01 DIAGNOSIS — N1831 Chronic kidney disease, stage 3a: Secondary | ICD-10-CM | POA: Diagnosis not present

## 2022-04-01 DIAGNOSIS — L989 Disorder of the skin and subcutaneous tissue, unspecified: Secondary | ICD-10-CM

## 2022-04-01 DIAGNOSIS — G479 Sleep disorder, unspecified: Secondary | ICD-10-CM

## 2022-04-01 DIAGNOSIS — I10 Essential (primary) hypertension: Secondary | ICD-10-CM | POA: Diagnosis not present

## 2022-04-01 DIAGNOSIS — R600 Localized edema: Secondary | ICD-10-CM

## 2022-04-01 DIAGNOSIS — J310 Chronic rhinitis: Secondary | ICD-10-CM

## 2022-04-01 DIAGNOSIS — F5101 Primary insomnia: Secondary | ICD-10-CM | POA: Diagnosis not present

## 2022-04-01 DIAGNOSIS — M810 Age-related osteoporosis without current pathological fracture: Secondary | ICD-10-CM

## 2022-04-01 DIAGNOSIS — G3184 Mild cognitive impairment, so stated: Secondary | ICD-10-CM

## 2022-04-01 NOTE — Patient Instructions (Signed)
Need TDAP and Shingrix shot from pharmacy

## 2022-04-01 NOTE — Progress Notes (Signed)
Location:  [place] of Service:  Clinic (12)  Provider:   Code Status:  Goals of Care:     04/01/2022    9:17 AM  Advanced Directives  Does Patient Have a Medical Advance Directive? Yes  Type of Paramedic of [place];Living will;Out of facility DNR (pink MOST or yellow form)  Does patient want to make changes to medical advance directive? No - Patient declined  Copy of [place] in Chart? No - copy requested     Chief Complaint  Patient presents with   Medical Management of Chronic Issues    6 month follow up    HPI: Patient is a 89 y.o. female seen today for medical management of chronic diseases.    Lives in [place] apartment Husband died recently  H/o Hypertension, Insomnia, Chronic Rhinitis   Doing well Had Pneumonia in 01/24 Symptoms now resolved  Has 2 spots in her Ears and face She is going to have appointment with her Dermatology  Also c/o LE Edema Chronic. Feels tight Gets better when she elevates her legs No SOB or cough or chest pain  Denies depression Walks and still drives Does repeat herself Daughter in town who is managing her finances now  Past Medical History:  Diagnosis Date   Age-related osteoporosis without current pathological fracture    B12 deficiency    Chronic kidney disease, stage 3 (HCC)    Hypercholesteremia    Hypertension with renal disease    Macrocytic anemia    Malnutrition, calorie ([place])    Sleep disorder     History reviewed. No pertinent surgical history.  Allergies  Allergen Reactions   Sulfa Antibiotics     Other reaction(s): vomiting/nausea    Outpatient Encounter Medications as of 04/01/2022  Medication Sig   benazepril-hydrochlorthiazide (LOTENSIN HCT) 20-12.5 MG tablet TAKE ONE TABLET EACH DAY   Cholecalciferol (VITAMIN D3) 100000 UNIT/GM POWD 1 capsule   Glucosamine-Chondroitin (GLUCOSAMINE CHONDR COMPLEX PO)    ipratropium  (ATROVENT) 0.03 % nasal spray Place 2 sprays into both nostrils daily.   temazepam (RESTORIL) 30 MG capsule TAKE ONE CAPSULE AT BEDTIME DAILY   vitamin B-12 (CYANOCOBALAMIN) 100 MCG tablet See admin instructions.   vitamin C (ASCORBIC ACID) 500 MG tablet Take 500 mg by mouth daily.   furosemide (LASIX) 20 MG tablet Take 1 tablet (20 mg total) by mouth in the morning for 3 days.   predniSONE (DELTASONE) 20 MG tablet Take 1 tablet (20 mg total) by mouth daily with breakfast. (Patient not taking: Reported on 04/01/2022)   No facility-administered encounter medications on file as of 04/01/2022.    Review of Systems:  Review of Systems  Constitutional:  Negative for activity change and appetite change.  HENT: Negative.    Respiratory:  Negative for cough and shortness of breath.   Cardiovascular:  Positive for leg swelling.  Gastrointestinal:  Negative for constipation.  Genitourinary: Negative.   Musculoskeletal:  Negative for arthralgias, gait problem and myalgias.  Skin: Negative.   Neurological:  Negative for dizziness and weakness.  Psychiatric/Behavioral:  Negative for confusion, dysphoric mood and sleep disturbance.     Health Maintenance  Topic Date Due   DTaP/Tdap/Td (3 - Td or Tdap) 06/29/2021   Medicare Annual Wellness (AWV)  10/31/2021   COVID-19 Vaccine (5 - 2023-24 season) 04/17/2022 (Originally 02/05/2022)   Zoster Vaccines- Shingrix (1 of 2) 05/27/2022 (Originally [birth year]/1979)   Pneumonia Vaccine 69+ Years old  Completed  INFLUENZA VACCINE  Completed   DEXA SCAN  Completed   HPV VACCINES  Aged Out    Physical Exam: Vitals:   04/01/22 0918  BP: 122/78  Pulse: 66  Resp: 16  Temp: 97.9 F (36.6 C)  TempSrc: Temporal  SpO2: 92%  Weight: 94 lb 9.6 oz (42.9 kg)  Height: 5' 3"$  (1.6 m)   Body mass index is 16.76 kg/m. Physical Exam Vitals reviewed.  Constitutional:      Appearance: Normal appearance.  HENT:     Head: Normocephalic.     Nose: Nose normal.      Mouth/Throat:     Mouth: Mucous membranes are moist.     Pharynx: Oropharynx is clear.  Eyes:     Pupils: Pupils are equal, round, and reactive to light.  Cardiovascular:     Rate and Rhythm: Normal rate and regular rhythm.     Pulses: Normal pulses.     Heart sounds: Normal heart sounds. No murmur heard. Pulmonary:     Effort: Pulmonary effort is normal.     Breath sounds: Normal breath sounds.  Abdominal:     General: Abdomen is flat. Bowel sounds are normal.     Palpations: Abdomen is soft.  Musculoskeletal:        General: Swelling present.     Cervical back: Neck supple.     Comments: Bilateral swelling mild Has Raynauds in her Fingers and Toes  Skin:    General: Skin is warm.  Neurological:     General: No focal deficit present.     Mental Status: She is alert and oriented to person, place, and time.  Psychiatric:        Mood and Affect: Mood normal.        Thought Content: Thought content normal.     Labs reviewed: Basic Metabolic Panel: Recent Labs    04/02/21 0000 09/26/21 0000  NA 148* 142  K 4.5 4.7  CL 110* 105  CO2 26* 21  BUN 29* 26*  CREATININE 1.0 1.1  CALCIUM 9.5 9.2  TSH 3.98  --    Liver Function Tests: Recent Labs    04/02/21 0000  AST 14  ALT 12  ALKPHOS 92  ALBUMIN 3.8   No results for input(s): "LIPASE", "AMYLASE" in the last 8760 hours. No results for input(s): "AMMONIA" in the last 8760 hours. CBC: Recent Labs    04/02/21 0000  WBC 4.2  HGB 11.2*  HCT 35*  PLT 202   Lipid Panel: No results for input(s): "CHOL", "HDL", "LDLCALC", "TRIG", "CHOLHDL", "LDLDIRECT" in the last 8760 hours. No results found for: "HGBA1C"  Procedures since last visit: No results found.  Assessment/Plan 1. Bilateral edema of lower extremity Elevate On HCTZ Also reminded her to wear her Stockings especially when cannot raise her legs  2. Stage 3a chronic kidney disease ([place]) Repeat Labs  3. Primary hypertension Good on Lotensin HCT  4.  Primary insomnia Restoril Refuses to GDR  5. Age-related osteoporosis without current pathological fracture Has DEXA order but she does not want to do it Add Calcium to her Vit D 6. Chronic rhinitis Atrovent spray  7. Skin lesion Is going to call Dermatology  8. Mild cognitive impairment Will need MMSE Schedule for AVW  9. Raynaud's phenomenon without gangrene 10 Needs TDAP and Shingrix   Labs/tests ordered:  CBC,CMP,TSH Next appt:  Visit date not found

## 2022-04-02 ENCOUNTER — Encounter: Payer: Medicare Other | Admitting: Internal Medicine

## 2022-04-02 ENCOUNTER — Other Ambulatory Visit: Payer: Self-pay | Admitting: Internal Medicine

## 2022-04-02 DIAGNOSIS — G479 Sleep disorder, unspecified: Secondary | ICD-10-CM

## 2022-04-02 NOTE — Telephone Encounter (Signed)
Duplicate

## 2022-04-17 LAB — BASIC METABOLIC PANEL
CO2: 23 — AB (ref 13–22)
Chloride: 109 — AB (ref 99–108)
Creatinine: 0.9 (ref 0.5–1.1)
Glucose: 87
Potassium: 4.7 mEq/L (ref 3.5–5.1)
Sodium: 144 (ref 137–147)

## 2022-04-17 LAB — COMPREHENSIVE METABOLIC PANEL
Albumin: 3.3 — AB (ref 3.5–5.0)
Calcium: 9.2 (ref 8.7–10.7)
Globulin: 2.2
eGFR: 62

## 2022-04-17 LAB — CBC AND DIFFERENTIAL
HCT: 32 — AB (ref 36–46)
Hemoglobin: 10 — AB (ref 12.0–16.0)
Neutrophils Absolute: 7.1
Platelets: 280 10*3/uL (ref 150–400)
WBC: 8.8

## 2022-04-17 LAB — HEPATIC FUNCTION PANEL
ALT: 13 U/L (ref 7–35)
AST: 18 (ref 13–35)
Bilirubin, Total: 0.2

## 2022-04-17 LAB — CBC: RBC: 3.63 — AB (ref 3.87–5.11)

## 2022-04-17 LAB — TSH: TSH: 3.22 (ref 0.41–5.90)

## 2022-05-02 ENCOUNTER — Other Ambulatory Visit: Payer: Self-pay | Admitting: Internal Medicine

## 2022-05-02 DIAGNOSIS — G479 Sleep disorder, unspecified: Secondary | ICD-10-CM

## 2022-06-04 ENCOUNTER — Other Ambulatory Visit: Payer: Self-pay | Admitting: Internal Medicine

## 2022-06-04 DIAGNOSIS — G479 Sleep disorder, unspecified: Secondary | ICD-10-CM

## 2022-06-09 ENCOUNTER — Encounter: Payer: Medicare Other | Admitting: Adult Health

## 2022-06-23 ENCOUNTER — Other Ambulatory Visit: Payer: Self-pay | Admitting: Internal Medicine

## 2022-07-04 ENCOUNTER — Other Ambulatory Visit: Payer: Self-pay | Admitting: Internal Medicine

## 2022-07-04 DIAGNOSIS — G479 Sleep disorder, unspecified: Secondary | ICD-10-CM

## 2022-08-03 NOTE — Progress Notes (Unsigned)
Location:  [facility]  POS: Clinic  Provider: Fletcher Anon, ANP  Code Status:  Goals of Care:    08/04/2022    3:05 PM  Advanced Directives  Does Patient Have a Medical Advance Directive? Yes  Type of Estate agent of [place];Living will;Out of facility DNR (pink MOST or yellow form)  Does patient want to make changes to medical advance directive? No - Patient declined     Chief Complaint  Patient presents with   Medical Management of Chronic Issues    Patient is here for a 4 month follow up with labs     HPI: Patient is a 89 y.o. female seen today for medical management of chronic diseases.    PMH  HTN, insomnia, chronic rhinitis, CKD, HLD,  macrocytic anemia, B12 def, venous suff  [place] husband died in skilled care this year.  Losing weight but reports she is not eating less. Tends to eat small amt sand skips dinner. Feels at peace about the death of her husband. Denies depression. Sleeps well.  Wt Readings from Last 3 Encounters:  08/04/22 83 lb 1.6 oz (37.7 kg)  04/01/22 94 lb 9.6 oz (42.9 kg)  02/25/22 88 lb 12.8 oz (40.3 kg)   Former smoker quit in 1989  No longer exercises but was formerly an avid exerciser   Hx of osteoporosis no recent DEXA. Has been ordered but she did not f/u on the order  Mammogram last normal 2019 no longer gets them due to her age.   Sleeping well on temazepam MCI: reports some difficulty remembering names. Still drives.     08/04/2022    3:06 PM  MMSE - Mini Mental State Exam  Orientation to time 5  Orientation to Place 5  Registration 3  Attention/ Calculation 5  Recall 2  Language- name 2 objects 2  Language- repeat 1  Language- follow 3 step command 3  Language- read & follow direction 1  Write a sentence 1  Copy design 1  Total score 29    Has blue discoloration to fingers when cold chronically.   Chronic leg edema on hydrochlorothiazide.   Aged out of mammogram and  colonoscopy  Past Medical History:  Diagnosis Date   Age-related osteoporosis without current pathological fracture    B12 deficiency    Chronic kidney disease, stage 3 (HCC)    Hypercholesteremia    Hypertension with renal disease    Macrocytic anemia    Malnutrition, calorie (HCC)    Sleep disorder     History reviewed. No pertinent surgical history.  Allergies  Allergen Reactions   Sulfa Antibiotics     Other reaction(s): vomiting/nausea    Outpatient Encounter Medications as of 08/04/2022  Medication Sig   benazepril-hydrochlorthiazide (LOTENSIN HCT) 20-12.5 MG tablet TAKE ONE TABLET EACH DAY   Cholecalciferol (VITAMIN D3) 100000 UNIT/GM POWD 1 capsule   Glucosamine-Chondroitin (GLUCOSAMINE CHONDR COMPLEX PO)    ipratropium (ATROVENT) 0.03 % nasal spray TWO SPRAYS INTO EACH NOSTRIL EVERY DAY   temazepam (RESTORIL) 30 MG capsule TAKE ONE CAPSULE AT BEDTIME DAILY   vitamin B-12 (CYANOCOBALAMIN) 100 MCG tablet See admin instructions.   vitamin C (ASCORBIC ACID) 500 MG tablet Take 500 mg by mouth daily.   No facility-administered encounter medications on file as of 08/04/2022.    Review of Systems:  Review of Systems  Constitutional:  Positive for unexpected weight change. Negative for activity change, appetite change, chills, diaphoresis, fatigue and fever.  HENT:  Negative for congestion.   Respiratory:  Negative for cough, shortness of breath and wheezing.   Cardiovascular:  Positive for leg swelling. Negative for chest pain and palpitations.  Gastrointestinal:  Negative for abdominal distention, abdominal pain, constipation and diarrhea.  Genitourinary:  Negative for difficulty urinating and dysuria.  Musculoskeletal:  Negative for arthralgias, back pain, gait problem, joint swelling and myalgias.  Neurological:  Negative for dizziness, tremors, seizures, syncope, facial asymmetry, speech difficulty, weakness, light-headedness, numbness and headaches.   Psychiatric/Behavioral:  Negative for agitation, behavioral problems and confusion.     Health Maintenance  Topic Date Due   Zoster Vaccines- Shingrix (1 of 2) Never done   DTaP/Tdap/Td (3 - Td or Tdap) 06/29/2021   COVID-19 Vaccine (5 - 2023-24 season) 08/20/2022 (Originally 02/05/2022)   Medicare Annual Wellness (AWV)  09/03/2022 (Originally 10/31/2021)   INFLUENZA VACCINE  09/11/2022   Pneumonia Vaccine 3+ Years old  Completed   DEXA SCAN  Completed   HPV VACCINES  Aged Out    Physical Exam: Vitals:   08/04/22 1503  BP: 124/80  Pulse: 97  Resp: 17  Temp: 97.6 F (36.4 C)  TempSrc: Temporal  SpO2: 91%  Weight: 83 lb 1.6 oz (37.7 kg)  Height: 5\' 3"  (1.6 m)   Body mass index is 14.72 kg/m. Physical Exam Vitals and nursing note reviewed.  Constitutional:      General: She is not in acute distress.    Appearance: She is not diaphoretic.  HENT:     Head: Normocephalic and atraumatic.     Right Ear: Tympanic membrane and ear canal normal.     Left Ear: Tympanic membrane and ear canal normal.     Nose: Nose normal.     Mouth/Throat:     Mouth: Mucous membranes are moist.     Pharynx: Oropharynx is clear.  Eyes:     Conjunctiva/sclera: Conjunctivae normal.     Pupils: Pupils are equal, round, and reactive to light.  Neck:     Vascular: No JVD.  Cardiovascular:     Rate and Rhythm: Normal rate and regular rhythm.     Heart sounds: No murmur heard. Pulmonary:     Effort: Pulmonary effort is normal. No respiratory distress.     Breath sounds: Normal breath sounds. No wheezing.  Chest:  Breasts:    Right: Normal. No swelling, bleeding, inverted nipple, mass, nipple discharge or skin change.     Left: Normal. No swelling, bleeding, inverted nipple, mass, nipple discharge or skin change.  Abdominal:     General: Bowel sounds are normal. There is no distension.     Palpations: Abdomen is soft.     Tenderness: There is no abdominal tenderness.  Musculoskeletal:      Comments: BLE edema +1 with varicose veins.   Skin:    General: Skin is warm and dry.  Neurological:     Mental Status: She is alert and oriented to person, place, and time.  Psychiatric:        Mood and Affect: Mood normal.     Labs reviewed: Basic Metabolic Panel: Recent Labs    09/26/21 0000 04/17/22 0000  NA 142 144  K 4.7 4.7  CL 105 109*  CO2 21 23*  BUN 26*  --   CREATININE 1.1 0.9  CALCIUM 9.2 9.2  TSH  --  3.22   Liver Function Tests: Recent Labs    04/17/22 0000  AST 18  ALT 13  ALBUMIN 3.3*   No  results for input(s): "LIPASE", "AMYLASE" in the last 8760 hours. No results for input(s): "AMMONIA" in the last 8760 hours. CBC: Recent Labs    04/17/22 0000  WBC 8.8  NEUTROABS 7.10  HGB 10.0*  HCT 32*  PLT 280   Lipid Panel: No results for input(s): "CHOL", "HDL", "LDLCALC", "TRIG", "CHOLHDL", "LDLDIRECT" in the last 8760 hours. No results found for: "HGBA1C"  Procedures since last visit: No results found.  Assessment/Plan  Weight loss Recommend dietary consult and nutritional supplement such as boost She will f/u back up in two months for weight check and labs.  TSH ok Breast exam normal Consider remeron vs additional testing.    Age-related osteoporosis without current pathological fracture Reiterated going to get dexa scan  Primary hypertension Controlled  Localized edema Improved  Continue Lotensin HCT  Stage 3b chronic kidney disease (HCC) Continue to periodically monitor BMP and avoid nephrotoxic agents Hydrate  Primary insomnia Continue temazepam  Anemia MCV 86 On B12 supplement  Lab Results  Component Value Date   HGB 10.0 (A) 04/17/2022  WIll repeat labs prior to next apt    Labs/tests ordered:  * No order type specified * CMP CBC iron panel B12 folate prior to next apt   Next appt: 6 months with Dr Chales Abrahams    Total time :  time greater than 50% of total time spent doing pt counseling and coordination of  care

## 2022-08-04 ENCOUNTER — Encounter: Payer: Self-pay | Admitting: Adult Health

## 2022-08-04 ENCOUNTER — Non-Acute Institutional Stay: Payer: Medicare Other | Admitting: Adult Health

## 2022-08-04 VITALS — BP 124/80 | HR 97 | Temp 97.6°F | Resp 17 | Ht 63.0 in | Wt 83.1 lb

## 2022-08-04 DIAGNOSIS — R600 Localized edema: Secondary | ICD-10-CM

## 2022-08-04 DIAGNOSIS — R634 Abnormal weight loss: Secondary | ICD-10-CM

## 2022-08-04 DIAGNOSIS — E538 Deficiency of other specified B group vitamins: Secondary | ICD-10-CM

## 2022-08-04 DIAGNOSIS — D649 Anemia, unspecified: Secondary | ICD-10-CM | POA: Diagnosis not present

## 2022-08-04 DIAGNOSIS — N1832 Chronic kidney disease, stage 3b: Secondary | ICD-10-CM

## 2022-08-04 DIAGNOSIS — I10 Essential (primary) hypertension: Secondary | ICD-10-CM

## 2022-08-04 DIAGNOSIS — E2839 Other primary ovarian failure: Secondary | ICD-10-CM

## 2022-08-04 NOTE — Patient Instructions (Signed)
Try ensure or other nutritional supplement daily  I will reach out to [facility] the dietician to contact you regarding weight gain strategies.   Come back in two months with labs prior to apt

## 2022-08-10 ENCOUNTER — Inpatient Hospital Stay (HOSPITAL_COMMUNITY)
Admission: EM | Admit: 2022-08-10 | Discharge: 2022-09-11 | DRG: 180 | Disposition: E | Payer: Medicare Other | Attending: Internal Medicine | Admitting: Internal Medicine

## 2022-08-10 ENCOUNTER — Inpatient Hospital Stay (HOSPITAL_COMMUNITY): Payer: Medicare Other

## 2022-08-10 ENCOUNTER — Emergency Department (HOSPITAL_COMMUNITY): Payer: Medicare Other

## 2022-08-10 ENCOUNTER — Encounter (HOSPITAL_COMMUNITY): Payer: Self-pay

## 2022-08-10 ENCOUNTER — Other Ambulatory Visit: Payer: Self-pay

## 2022-08-10 DIAGNOSIS — I3139 Other pericardial effusion (noninflammatory): Secondary | ICD-10-CM | POA: Diagnosis present

## 2022-08-10 DIAGNOSIS — M6250 Muscle wasting and atrophy, not elsewhere classified, unspecified site: Secondary | ICD-10-CM | POA: Diagnosis present

## 2022-08-10 DIAGNOSIS — I129 Hypertensive chronic kidney disease with stage 1 through stage 4 chronic kidney disease, or unspecified chronic kidney disease: Secondary | ICD-10-CM | POA: Diagnosis present

## 2022-08-10 DIAGNOSIS — E860 Dehydration: Secondary | ICD-10-CM | POA: Diagnosis present

## 2022-08-10 DIAGNOSIS — I361 Nonrheumatic tricuspid (valve) insufficiency: Secondary | ICD-10-CM

## 2022-08-10 DIAGNOSIS — Z634 Disappearance and death of family member: Secondary | ICD-10-CM

## 2022-08-10 DIAGNOSIS — J90 Pleural effusion, not elsewhere classified: Secondary | ICD-10-CM | POA: Diagnosis present

## 2022-08-10 DIAGNOSIS — Z515 Encounter for palliative care: Secondary | ICD-10-CM

## 2022-08-10 DIAGNOSIS — R54 Age-related physical debility: Secondary | ICD-10-CM | POA: Diagnosis present

## 2022-08-10 DIAGNOSIS — S3210XA Unspecified fracture of sacrum, initial encounter for closed fracture: Secondary | ICD-10-CM | POA: Diagnosis present

## 2022-08-10 DIAGNOSIS — C3490 Malignant neoplasm of unspecified part of unspecified bronchus or lung: Principal | ICD-10-CM | POA: Diagnosis present

## 2022-08-10 DIAGNOSIS — S32592A Other specified fracture of left pubis, initial encounter for closed fracture: Secondary | ICD-10-CM | POA: Diagnosis present

## 2022-08-10 DIAGNOSIS — Z87891 Personal history of nicotine dependence: Secondary | ICD-10-CM

## 2022-08-10 DIAGNOSIS — R000 Tachycardia, unspecified: Secondary | ICD-10-CM | POA: Diagnosis present

## 2022-08-10 DIAGNOSIS — Z66 Do not resuscitate: Secondary | ICD-10-CM | POA: Diagnosis present

## 2022-08-10 DIAGNOSIS — Z79899 Other long term (current) drug therapy: Secondary | ICD-10-CM

## 2022-08-10 DIAGNOSIS — S32591A Other specified fracture of right pubis, initial encounter for closed fracture: Secondary | ICD-10-CM | POA: Diagnosis present

## 2022-08-10 DIAGNOSIS — M810 Age-related osteoporosis without current pathological fracture: Secondary | ICD-10-CM | POA: Diagnosis present

## 2022-08-10 DIAGNOSIS — N1832 Chronic kidney disease, stage 3b: Secondary | ICD-10-CM | POA: Diagnosis present

## 2022-08-10 DIAGNOSIS — Z9181 History of falling: Secondary | ICD-10-CM

## 2022-08-10 DIAGNOSIS — R680 Hypothermia, not associated with low environmental temperature: Secondary | ICD-10-CM | POA: Diagnosis present

## 2022-08-10 DIAGNOSIS — E7800 Pure hypercholesterolemia, unspecified: Secondary | ICD-10-CM | POA: Diagnosis present

## 2022-08-10 DIAGNOSIS — G479 Sleep disorder, unspecified: Secondary | ICD-10-CM | POA: Diagnosis present

## 2022-08-10 DIAGNOSIS — R627 Adult failure to thrive: Secondary | ICD-10-CM | POA: Diagnosis present

## 2022-08-10 DIAGNOSIS — J910 Malignant pleural effusion: Secondary | ICD-10-CM | POA: Diagnosis present

## 2022-08-10 DIAGNOSIS — E43 Unspecified severe protein-calorie malnutrition: Secondary | ICD-10-CM | POA: Diagnosis present

## 2022-08-10 DIAGNOSIS — D519 Vitamin B12 deficiency anemia, unspecified: Secondary | ICD-10-CM | POA: Diagnosis present

## 2022-08-10 DIAGNOSIS — J9601 Acute respiratory failure with hypoxia: Secondary | ICD-10-CM | POA: Diagnosis present

## 2022-08-10 DIAGNOSIS — E872 Acidosis, unspecified: Secondary | ICD-10-CM | POA: Diagnosis present

## 2022-08-10 DIAGNOSIS — W19XXXA Unspecified fall, initial encounter: Secondary | ICD-10-CM | POA: Diagnosis present

## 2022-08-10 DIAGNOSIS — R918 Other nonspecific abnormal finding of lung field: Principal | ICD-10-CM

## 2022-08-10 DIAGNOSIS — Z681 Body mass index (BMI) 19 or less, adult: Secondary | ICD-10-CM

## 2022-08-10 DIAGNOSIS — Z882 Allergy status to sulfonamides status: Secondary | ICD-10-CM

## 2022-08-10 DIAGNOSIS — S3282XA Multiple fractures of pelvis without disruption of pelvic ring, initial encounter for closed fracture: Secondary | ICD-10-CM

## 2022-08-10 DIAGNOSIS — R296 Repeated falls: Secondary | ICD-10-CM | POA: Diagnosis present

## 2022-08-10 DIAGNOSIS — I3131 Malignant pericardial effusion in diseases classified elsewhere: Secondary | ICD-10-CM | POA: Diagnosis present

## 2022-08-10 LAB — CBC WITH DIFFERENTIAL/PLATELET
Abs Immature Granulocytes: 0.76 10*3/uL — ABNORMAL HIGH (ref 0.00–0.07)
Basophils Absolute: 0.1 10*3/uL (ref 0.0–0.1)
Basophils Relative: 0 %
Eosinophils Absolute: 0 10*3/uL (ref 0.0–0.5)
Eosinophils Relative: 0 %
HCT: 37.1 % (ref 36.0–46.0)
Hemoglobin: 10.7 g/dL — ABNORMAL LOW (ref 12.0–15.0)
Immature Granulocytes: 2 %
Lymphocytes Relative: 2 %
Lymphs Abs: 0.8 10*3/uL (ref 0.7–4.0)
MCH: 23 pg — ABNORMAL LOW (ref 26.0–34.0)
MCHC: 28.8 g/dL — ABNORMAL LOW (ref 30.0–36.0)
MCV: 79.6 fL — ABNORMAL LOW (ref 80.0–100.0)
Monocytes Absolute: 1.6 10*3/uL — ABNORMAL HIGH (ref 0.1–1.0)
Monocytes Relative: 5 %
Neutro Abs: 29.5 10*3/uL — ABNORMAL HIGH (ref 1.7–7.7)
Neutrophils Relative %: 91 %
Platelets: 426 10*3/uL — ABNORMAL HIGH (ref 150–400)
RBC: 4.66 MIL/uL (ref 3.87–5.11)
RDW: 15.1 % (ref 11.5–15.5)
WBC: 32.8 10*3/uL — ABNORMAL HIGH (ref 4.0–10.5)
nRBC: 0 % (ref 0.0–0.2)

## 2022-08-10 LAB — BLOOD GAS, ARTERIAL
Acid-Base Excess: 0.3 mmol/L (ref 0.0–2.0)
Bicarbonate: 25.4 mmol/L (ref 20.0–28.0)
Drawn by: 225631
FIO2: 21 %
O2 Saturation: 71.8 %
Patient temperature: 35.8
RATE: 18 {breaths}/min
pCO2 arterial: 40 mmHg (ref 32–48)
pH, Arterial: 7.41 (ref 7.35–7.45)
pO2, Arterial: 40 mmHg — CL (ref 83–108)

## 2022-08-10 LAB — COMPREHENSIVE METABOLIC PANEL WITH GFR
ALT: 49 U/L — ABNORMAL HIGH (ref 0–44)
AST: 35 U/L (ref 15–41)
Albumin: 2.6 g/dL — ABNORMAL LOW (ref 3.5–5.0)
Alkaline Phosphatase: 94 U/L (ref 38–126)
Anion gap: 12 (ref 5–15)
BUN: 30 mg/dL — ABNORMAL HIGH (ref 8–23)
CO2: 24 mmol/L (ref 22–32)
Calcium: 9.4 mg/dL (ref 8.9–10.3)
Chloride: 101 mmol/L (ref 98–111)
Creatinine, Ser: 1.14 mg/dL — ABNORMAL HIGH (ref 0.44–1.00)
GFR, Estimated: 45 mL/min — ABNORMAL LOW
Glucose, Bld: 148 mg/dL — ABNORMAL HIGH (ref 70–99)
Potassium: 4.7 mmol/L (ref 3.5–5.1)
Sodium: 137 mmol/L (ref 135–145)
Total Bilirubin: 0.8 mg/dL (ref 0.3–1.2)
Total Protein: 6.4 g/dL — ABNORMAL LOW (ref 6.5–8.1)

## 2022-08-10 LAB — ECHOCARDIOGRAM COMPLETE
Calc EF: 78.7 %
Est EF: >75
Height: 63 in
S' Lateral: 1.4 cm
Single Plane A2C EF: 78.6 %
Single Plane A4C EF: 77.1 %
Weight: 1328 oz

## 2022-08-10 LAB — CULTURE, BLOOD (ROUTINE X 2)

## 2022-08-10 LAB — LACTIC ACID, PLASMA
Lactic Acid, Venous: 2.1 mmol/L (ref 0.5–1.9)
Lactic Acid, Venous: 1.6 mmol/L (ref 0.5–1.9)

## 2022-08-10 LAB — MRSA NEXT GEN BY PCR, NASAL: MRSA by PCR Next Gen: NOT DETECTED

## 2022-08-10 MED ORDER — IOHEXOL 300 MG/ML  SOLN
75.0000 mL | Freq: Once | INTRAMUSCULAR | Status: AC | PRN
  Administered 2022-08-10: 75 mL via INTRAVENOUS

## 2022-08-10 MED ORDER — SODIUM CHLORIDE (PF) 0.9 % IJ SOLN
INTRAMUSCULAR | Status: AC
  Filled 2022-08-10: qty 50

## 2022-08-10 MED ORDER — SODIUM CHLORIDE 0.9 % IV SOLN
2.0000 g | INTRAVENOUS | Status: DC
  Filled 2022-08-10: qty 12.5

## 2022-08-10 MED ORDER — DOCUSATE SODIUM 100 MG PO CAPS: 100.0000 mg | ORAL_CAPSULE | Freq: Every day | ORAL | Status: DC

## 2022-08-10 MED ORDER — ENSURE ENLIVE PO LIQD: 237.0000 mL | Freq: Two times a day (BID) | ORAL | Status: DC

## 2022-08-10 MED ORDER — VANCOMYCIN HCL 500 MG/100ML IV SOLN: 500.0000 mg | INTRAVENOUS | Status: DC

## 2022-08-10 MED ORDER — SODIUM CHLORIDE 0.9 % IV SOLN
2.0000 g | Freq: Once | INTRAVENOUS | Status: AC
  Administered 2022-08-10: 2 g via INTRAVENOUS
  Filled 2022-08-10: qty 12.5

## 2022-08-10 MED ORDER — ACETAMINOPHEN 650 MG RE SUPP: 650.0000 mg | Freq: Four times a day (QID) | RECTAL | Status: DC | PRN

## 2022-08-10 MED ORDER — VANCOMYCIN HCL IN DEXTROSE 1-5 GM/200ML-% IV SOLN: 1000.0000 mg | Freq: Once | INTRAVENOUS | Status: DC

## 2022-08-10 MED ORDER — METRONIDAZOLE 500 MG/100ML IV SOLN
500.0000 mg | Freq: Once | INTRAVENOUS | Status: AC
  Administered 2022-08-10: 500 mg via INTRAVENOUS
  Filled 2022-08-10: qty 100

## 2022-08-10 MED ORDER — VANCOMYCIN HCL 750 MG/150ML IV SOLN
750.0000 mg | Freq: Once | INTRAVENOUS | Status: AC
  Administered 2022-08-10: 750 mg via INTRAVENOUS
  Filled 2022-08-10: qty 150

## 2022-08-10 MED ORDER — ONDANSETRON HCL 4 MG/2ML IJ SOLN
4.0000 mg | Freq: Four times a day (QID) | INTRAMUSCULAR | Status: DC | PRN
  Administered 2022-08-10: 4 mg via INTRAVENOUS
  Filled 2022-08-10: qty 2

## 2022-08-10 MED ORDER — HEPARIN SODIUM (PORCINE) 5000 UNIT/ML IJ SOLN
5000.0000 [IU] | Freq: Three times a day (TID) | INTRAMUSCULAR | Status: DC
  Administered 2022-08-10 (×2): 5000 [IU] via SUBCUTANEOUS
  Filled 2022-08-10 (×2): qty 1

## 2022-08-10 MED ORDER — RENA-VITE PO TABS
1.0000 | ORAL_TABLET | Freq: Every day | ORAL | Status: DC
  Administered 2022-08-10: 1 via ORAL
  Filled 2022-08-10 (×2): qty 1

## 2022-08-10 MED ORDER — ONDANSETRON HCL 4 MG PO TABS: 4.0000 mg | ORAL_TABLET | Freq: Four times a day (QID) | ORAL | Status: DC | PRN

## 2022-08-10 MED ORDER — ACETAMINOPHEN 325 MG PO TABS
650.0000 mg | ORAL_TABLET | Freq: Four times a day (QID) | ORAL | Status: DC | PRN
  Administered 2022-08-10: 650 mg via ORAL
  Filled 2022-08-10: qty 2

## 2022-08-10 MED ORDER — LACTATED RINGERS IV BOLUS (SEPSIS)
1000.0000 mL | Freq: Once | INTRAVENOUS | Status: AC
  Administered 2022-08-10: 1000 mL via INTRAVENOUS

## 2022-08-10 MED ORDER — OXYCODONE HCL 5 MG PO TABS: 5.0000 mg | ORAL_TABLET | ORAL | Status: DC | PRN

## 2022-08-10 MED ORDER — ENOXAPARIN SODIUM 40 MG/0.4ML IJ SOSY: 40.0000 mg | PREFILLED_SYRINGE | INTRAMUSCULAR | Status: DC

## 2022-08-10 MED ORDER — MORPHINE SULFATE (PF) 2 MG/ML IV SOLN: 1.0000 mg | INTRAVENOUS | Status: DC | PRN

## 2022-08-10 MED ORDER — LACTATED RINGERS IV SOLN: INTRAVENOUS | Status: AC

## 2022-08-10 MED ORDER — METOCLOPRAMIDE HCL 5 MG/ML IJ SOLN
10.0000 mg | Freq: Four times a day (QID) | INTRAMUSCULAR | Status: DC | PRN
  Administered 2022-08-10 (×2): 10 mg via INTRAVENOUS
  Filled 2022-08-10 (×2): qty 2

## 2022-08-10 MED ORDER — ORAL CARE MOUTH RINSE: 15.0000 mL | OROMUCOSAL | Status: DC | PRN

## 2022-08-10 MED ORDER — SODIUM CHLORIDE 0.9% FLUSH: 3.0000 mL | Freq: Two times a day (BID) | INTRAVENOUS | Status: DC

## 2022-08-10 NOTE — H&P (Addendum)
History and Physical    Patient: Erika Dean UJW:[id number] DOB: 1926/08/07 DOA: 08/06/2022 DOS: the patient was seen and examined on 07/13/2022 PCP: Mahlon Gammon, MD  Patient coming from: ALF[facility]  Chief Complaint:  Chief Complaint  Patient presents with   Fall    Pt BIBA from [facility] after experiencing new onset of generalized weakness to BLE and unable to bear weight to her legs. Pt states she tried on 6 different occasions to stand up and each time continued to fall. Denies LOC, blood thinners, or hitting her head.No head lacs or abrasions noted.   HPI: NATOYA RASH is a 89 y.o. female with medical history significant of osteoporosis, anemia secondary to B12 deficiency, CKD stage III, dyslipidemia, hypertension, and chronic malnutrition BMI 14.7.  Patient lives at [facility] and was still driving.  Her husband died in 05-23-22 and he was 37 years old.  She is a DNR.  Patient was in her usual state of health until yesterday when she became weak in both of her legs and was unable to stand and fell.  According to outpatient visit with PCP on 6/24 she had apparently been having unintentional weight loss.  She weighed 94 pounds in February and as of most recent visit she was down to 83 pounds.  On the date of mentation to the ER patient stood and felt like her legs gave way and suddenly fell.  She was having pelvic pain and was brought to the ER for further evaluation.  In the ER she was mildly hypothermic oral temperature of 96.5, she was tachycardic but was normotensive.  Initial O2 sats/pulse oximetry was unable to be accurately obtained due to cold extremities.  Initial readings after that were in the 64-65 range. After she was completely warmed her O2 sats were 93% and 4 L of O2 was applied.  Because of the low O2 saturations a chest x-ray was obtained. This revealed an asymmetric rounded density within the left hilum suspicious for adenopathy or mass.  There was an  associated moderate left pleural effusion.  She was also complaining of pelvic pain and inability to bear weight.  Initial plain films were unremarkable.  Continue to have pain.  Because of the abnormal chest x-ray and concern for hairline pelvic fracture CT chest abdomen and pelvis was obtained.  Guards to concern of her pelvic fractures there were multiple nondisplaced fractures of the inferior pubic rami bilaterally, left pubic bone and the sacral alla bilaterally.  In regards to the questionable mass seen on chest x-ray she was unfortunately found to have a large mass in the left hemithorax which was highly invasive into the mediastinum with evidence of pericardial invasion with a likely malignant left pleural effusion.  The mass was estimated at 7.1 x 5.9 x 6.9 cm and was highly concerning for bronchogenic neoplasm.  Patient does have a history of tobacco usage but quit smoking in 1989.  Other workup revealed elevated WBC 32,800 with left shift.  Hemoglobin 10.7, glucose 148, BUN 30 with creatinine of 1.14, mildly elevated ALT at 49, lactic acid 2.1.  Blood cultures were obtained.  Due to elevated lactic acid patient was given 1 L of LR and she was initiated on LR infusion for total of 20 hours at 150 cc/h.  She was given empiric Maxipime and vancomycin.  Hospitalist service was consulted for admission.   Review of Systems: As mentioned in the history of present illness. All other systems reviewed and  are negative. Past Medical History:  Diagnosis Date   Age-related osteoporosis without current pathological fracture    B12 deficiency    Chronic kidney disease, stage 3 (HCC)    Hypercholesteremia    Hypertension with renal disease    Macrocytic anemia    Malnutrition, calorie (HCC)    Sleep disorder    History reviewed. No pertinent surgical history. Social History:  reports that she has quit smoking. Her smoking use included cigarettes. She has never used smokeless tobacco. No history on file  for alcohol use and drug use.  Allergies  Allergen Reactions   Sulfa Antibiotics     Other reaction(s): vomiting/nausea    Family History  Problem Relation Age of Onset   Breast cancer Neg Hx     Prior to Admission medications   Medication Sig Start Date End Date Taking? Authorizing Provider  benazepril-hydrochlorthiazide (LOTENSIN HCT) 20-12.5 MG tablet TAKE ONE TABLET EACH DAY Patient taking differently: Take 1 tablet by mouth daily. 08/08/21  Yes Mahlon Gammon, MD  Glucosamine-Chondroitin (GLUCOSAMINE CHONDR COMPLEX PO) Take 1 tablet by mouth daily.   Yes [provider]  ipratropium (ATROVENT) 0.03 % nasal spray TWO SPRAYS INTO EACH NOSTRIL EVERY DAY Patient taking differently: Place 2 sprays into both nostrils daily. 06/23/22  Yes Mahlon Gammon, MD  temazepam (RESTORIL) 30 MG capsule TAKE ONE CAPSULE AT BEDTIME DAILY Patient taking differently: Take 30 mg by mouth at bedtime. 07/04/22  Yes Wert, Trula Ore, NP  vitamin B-12 (CYANOCOBALAMIN) 100 MCG tablet Take 100 mcg by mouth daily. 08/13/15  Yes [provider]  vitamin C (ASCORBIC ACID) 500 MG tablet Take 500 mg by mouth daily.   Yes [provider]  VITAMIN D PO Take 1 tablet by mouth daily.   Yes [provider]    Physical Exam: Vitals:   07/21/2022 0735 07/30/2022 0736 07/30/2022 0800 08/08/2022 0816  BP:   (!) 126/107   Pulse: (!) 106  (!) 106   Resp: 18  (!) 23   Temp:  97.7 F (36.5 C)    TempSrc:  Oral    SpO2: 100%  93%   Weight:    37.6 kg  Height:    5\' 3"  (1.6 m)   Constitutional: NAD, calm, comfortable; patient otherwise does appear underweight but color is good Respiratory: Focal crackles left upper chest with diminished sounds throughout the remainder of the left lung, right lung sounds clear to auscultation.  No wheezing, no crackles. Normal respiratory effort. No accessory muscle use.  4 L oxygen Cardiovascular: Regular rate and rhythm, no murmurs / rubs / gallops. No  extremity edema. 2+ pedal pulses. Abdomen: no tenderness, no masses palpated. No VS hepatosplenomegaly. Bowel sounds positive.  Musculoskeletal: no clubbing / cyanosis. No joint deformity upper and lower extremities. Good ROM, no contractures. Normal muscle tone.  Neuro with palpation over the anterior pelvic bones bilaterally Skin: no rashes, lesions, ulcers. No induration Neurologic: CN 2-12 grossly intact. Sensation intact,  Strength 5/5 x all 4 extremities.  Psychiatric: Normal judgment and insight. Alert and oriented x 3. Normal mood.   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  This has been explained to patient.  She confirms DNR.  She agrees to palliative evaluation.  Consult Joslyn Devon has been ordered Unclear if she is a candidate for  therapeutic/comfort thoracentesis and or focal radiation to the mass. Patient request that we do not talk to her daughter Kirt Boys regarding this information.  She wants to talk to her daughter first. Continue supportive care with oxygen She also appears to have possibly obstructive pneumonia secondary to mass so we will continue Maxipime and vancomycin for now Patient has had unintended weight loss of 13 pounds since February Given pericardial invasion will further characterize by obtaining echocardiogram  Bilateral pelvic fractures Unable to weight-bear due to pain Combination of oral and IV narcotics and adjust as tolerated PT evaluation  Elevated lactic acid Likely a result of increased demand from hypoxemia and dehydration from weight loss Given low albumin level will decrease IV fluids from 150 an hour to 75 an hour Pete lactic acid to see if trend is downward after initiation of oxygen and IV fluids  Severe protein  calorie malnutrition BMI is 14-albumin 2.6 with total protein 6.4 Unintended weight loss of 13 pounds since February Nutrition consultation Likely precipitated by evolving bronchogenic carcinoma process  Chronic kidney disease stage IIIb Baseline creatinine 0.9 with current creatinine 1.14 Continue gentle IV fluid hydration as above  Hypertension Current BP appears well-controlled-[place] secondary to the weight loss We will hold off for now on treating with medications Narcotics will possibly dry blood pressure down as well  Macrocytic anemia Hemoglobin at baseline around 10  Dyslipidemia Statins or other medications for now   Advance Care Planning:   Code Status: DNR   VTE prophylaxis: SQ Heparin  Consults: Pulmonary medicine, palliative care  Family Communication: Per patient request she is asked that we not speak with her daughter Maurine Simmering until she informed her of the new diagnosis regarding her possible lung cancer.  Molly's contact information is [phone]  Severity of Illness: The appropriate patient status for this patient is INPATIENT. Inpatient status is judged to be reasonable and necessary in order to provide the required intensity of service to ensure the patient's safety. The patient's presenting symptoms, physical exam findings, and initial radiographic and laboratory data in the context of their chronic comorbidities is felt to place them at high risk for further clinical deterioration. Furthermore, it is not anticipated that the patient will be medically stable for discharge from the hospital within 2 midnights of admission.   * I certify that at the point of admission it is my clinical judgment that the patient will require inpatient hospital care spanning beyond 2 midnights from the point of admission due to high intensity of service, high risk for further deterioration and high frequency of surveillance required.*  Author: Junious Silk, NP 07/17/2022 8:59  AM  For on call review [email or web address].

## 2022-08-10 NOTE — Progress Notes (Signed)
[facility] [facility] Liaison Note  Referral received from [facility], Amada [place], for outpatient palliative services at [facility] for patient at discharge. [facility] liaison will follow for discharge disposition.  [facility] will follow up with patient for outpatient-based palliative services after discharge.  Please call with any outpatient-based palliative or hospice related services.  Doreatha Martin, RN, BSN [facility] Liaison [phone]

## 2022-08-10 NOTE — Progress Notes (Signed)
RT note: ABG drawn while on room air, placed to 4 lpm n/c, pending results, RN made aware.

## 2022-08-10 NOTE — ED Notes (Signed)
PCCM at bedside. °

## 2022-08-10 NOTE — ED Provider Notes (Addendum)
[place] EMERGENCY DEPARTMENT AT [facility] Provider Note   CSN: [id number] Arrival date & time: 07/22/2022  0124     History  Chief Complaint  Patient presents with   Fall    Pt BIBA from [facility] after experiencing new onset of generalized weakness to BLE and unable to bear weight to her legs. Pt states she tried on 6 different occasions to stand up and each time continued to fall. Denies LOC, blood thinners, or hitting her head.No head lacs or abrasions noted.    Erika Dean is a 89 y.o. female.  Presents to the emergency department for evaluation after a fall.  Patient reports that she was at home when her fall occurred.  She reports that her legs just gave out and she fell to the ground.  She got up with some difficulty but has not been able to ambulate since the fall, has fallen multiple times at home.  She did not hit her head or lose consciousness.  No headache currently.  He denies back pain.  She is comfortable at rest but if she tries to move her legs she has pain in the tailbone area.       Home Medications Prior to Admission medications   Medication Sig Start Date End Date Taking? Authorizing Provider  benazepril-hydrochlorthiazide (LOTENSIN HCT) 20-12.5 MG tablet TAKE ONE TABLET EACH DAY Patient taking differently: Take 1 tablet by mouth daily. 08/08/21  Yes Mahlon Gammon, MD  Glucosamine-Chondroitin (GLUCOSAMINE CHONDR COMPLEX PO) Take 1 tablet by mouth daily.   Yes [provider]  ipratropium (ATROVENT) 0.03 % nasal spray TWO SPRAYS INTO EACH NOSTRIL EVERY DAY Patient taking differently: Place 2 sprays into both nostrils daily. 06/23/22  Yes Mahlon Gammon, MD  temazepam (RESTORIL) 30 MG capsule TAKE ONE CAPSULE AT BEDTIME DAILY Patient taking differently: Take 30 mg by mouth at bedtime. 07/04/22  Yes Wert, Trula Ore, NP  vitamin B-12 (CYANOCOBALAMIN) 100 MCG tablet Take 100 mcg by mouth daily. 08/13/15  Yes [provider]  vitamin C  (ASCORBIC ACID) 500 MG tablet Take 500 mg by mouth daily.   Yes [provider]  VITAMIN D PO Take 1 tablet by mouth daily.   Yes [provider]      Allergies    Sulfa antibiotics    Review of Systems   Review of Systems  Physical Exam Updated Vital Signs BP 138/86   Pulse (!) 109   Temp (!) 97.5 F (36.4 C)   Resp (!) 24   SpO2 95%  Physical Exam Vitals and nursing note reviewed.  Constitutional:      General: She is not in acute distress.    Appearance: She is well-developed.  HENT:     Head: Normocephalic and atraumatic.     Mouth/Throat:     Mouth: Mucous membranes are moist.  Eyes:     General: Vision grossly intact. Gaze aligned appropriately.     Extraocular Movements: Extraocular movements intact.     Conjunctiva/sclera: Conjunctivae normal.  Cardiovascular:     Rate and Rhythm: Normal rate and regular rhythm.     Pulses: Normal pulses.     Heart sounds: Normal heart sounds, S1 normal and S2 normal. No murmur heard.    No friction rub. No gallop.  Pulmonary:     Effort: Pulmonary effort is normal. No respiratory distress.     Breath sounds: Normal breath sounds.  Abdominal:     General: Bowel sounds are  normal.     Palpations: Abdomen is soft.     Tenderness: There is no abdominal tenderness. There is no guarding or rebound.     Hernia: No hernia is present.  Musculoskeletal:        General: No swelling.     Cervical back: Full passive range of motion without pain, normal range of motion and neck supple. No spinous process tenderness or muscular tenderness. Normal range of motion.     Right hip: No deformity. Decreased range of motion.     Left hip: No deformity. Decreased range of motion.     Right lower leg: No edema.     Left lower leg: No edema.     Comments: Pain with motion of both legs at the hips, pain seems to be more in the posterior sacral area  Skin:    General: Skin is warm and dry.     Capillary Refill: Capillary refill  takes less than 2 seconds.     Findings: No ecchymosis, erythema, rash or wound.  Neurological:     General: No focal deficit present.     Mental Status: She is alert and oriented to person, place, and time.     GCS: GCS eye subscore is 4. GCS verbal subscore is 5. GCS motor subscore is 6.     Cranial Nerves: Cranial nerves 2-12 are intact.     Sensory: Sensation is intact.     Motor: Motor function is intact.     Coordination: Coordination is intact.  Psychiatric:        Attention and Perception: Attention normal.        Mood and Affect: Mood normal.        Speech: Speech normal.        Behavior: Behavior normal.     ED Results / Procedures / Treatments   Labs (all labs ordered are listed, but only abnormal results are displayed) Labs Reviewed  CBC WITH DIFFERENTIAL/PLATELET - Abnormal; Notable for the following components:      Result Value   WBC 32.8 (*)    Hemoglobin 10.7 (*)    MCV 79.6 (*)    MCH 23.0 (*)    MCHC 28.8 (*)    Platelets 426 (*)    Neutro Abs 29.5 (*)    Monocytes Absolute 1.6 (*)    Abs Immature Granulocytes 0.76 (*)    All other components within normal limits  COMPREHENSIVE METABOLIC PANEL - Abnormal; Notable for the following components:   Glucose, Bld 148 (*)    BUN 30 (*)    Creatinine, Ser 1.14 (*)    Total Protein 6.4 (*)    Albumin 2.6 (*)    ALT 49 (*)    GFR, Estimated 45 (*)    All other components within normal limits  LACTIC ACID, PLASMA - Abnormal; Notable for the following components:   Lactic Acid, Venous 2.1 (*)    All other components within normal limits  BLOOD GAS, ARTERIAL - Abnormal; Notable for the following components:   pO2, Arterial 40 (*)    All other components within normal limits  CULTURE, BLOOD (ROUTINE X 2)  CULTURE, BLOOD (ROUTINE X 2)  URINALYSIS, ROUTINE W REFLEX MICROSCOPIC  URINALYSIS, W/ REFLEX TO CULTURE (INFECTION SUSPECTED)    EKG EKG Interpretation Date/Time:  Sunday August 10 2022 01:44:17  EDT Ventricular Rate:  119 PR Interval:  155 QRS Duration:  88 QT Interval:  314 QTC Calculation: 442 R Axis:  101  Text Interpretation: Sinus tachycardia Ventricular premature complex Low voltage, extremity leads No significant change since last tracing Confirmed by Gilda Crease [phone]) on 07/20/2022 2:20:26 AM  Radiology CT CHEST ABDOMEN PELVIS W CONTRAST  Result Date: 07/24/2022 CLINICAL DATA:  89 year old female with history of new onset of generalized weakness. Unable to bear weight. Chest mass. Evaluate for primary tumor and metastatic disease. * Tracking Code: BO * EXAM: CT CHEST, ABDOMEN, AND PELVIS WITH CONTRAST TECHNIQUE: Multidetector CT imaging of the chest, abdomen and pelvis was performed following the standard protocol during bolus administration of intravenous contrast. RADIATION DOSE REDUCTION: This exam was performed according to the departmental dose-optimization program which includes automated exposure control, adjustment of the mA and/or kV according to patient size and/or use of iterative reconstruction technique. CONTRAST:  75mL OMNIPAQUE IOHEXOL 300 MG/ML  SOLN COMPARISON:  Chest x-ray 07/30/2022. FINDINGS: CT CHEST FINDINGS Cardiovascular: Heart size is normal. Small amount of pericardial fluid and/or thickening, unlikely to be of hemodynamic significance at this time. Mass intimately associated with the pericardium (discussed below), with several areas of mild pericardial enhancement and nodularity, best appreciated with the pericardium overlying the proximal ascending thoracic aorta (axial image 25 of series 2) where an enhancing soft tissue nodule measures up to 1.3 x 0.7 cm. Another prominent soft tissue nodule associated with the pericardium is located just to the left of the pulmonic valve (axial image 37 of series 2) measuring 1.3 x 0.9 cm. There is aortic atherosclerosis, as well as atherosclerosis of the great vessels of the mediastinum and the coronary  arteries, including calcified atherosclerotic plaque in the left anterior descending coronary artery. Mediastinum/Nodes: Invasive centrally low-attenuation peripherally rim enhancing mass extending into the anterior mediastinum making broad contact with the pericardium overlying the proximal ascending thoracic aorta, pulmonic trunk and proximal left main pulmonary artery, and superior aspect of the heart, as detailed below. No definite pathologically enlarged mediastinal or hilar lymph nodes are confidently identified. Esophagus is unremarkable in appearance. Lungs/Pleura: Large mass in the left hemithorax, the epicenter of which is uncertain, but favored to be arising in the medial aspect of the left upper lobe (axial image 29 of series 2 and coronal image 77 of series 8) and estimated to measure 7.1 x 5.9 x 6.9 cm. This lesion is centrally low-attenuation with extensive irregular peripheral hyperenhancement, and appears highly invasive into the adjacent mediastinum where it makes intimate contact with adjacent vascular structures and pericardium, as detailed above. Some small pulmonary nodules are also noted in the right lung, largest of which is in the lateral segment of the right middle lobe (axial image 90 of series 6) measuring 6 x 7 mm. Large left pleural effusion, likely malignant. Trace right pleural effusion. Areas of passive subsegmental atelectasis are noted in the lower lobes of the lungs bilaterally (left-greater-than-right). Diffuse bronchial wall thickening with moderate to severe centrilobular and paraseptal emphysema. Musculoskeletal: There are no aggressive appearing lytic or blastic lesions noted in the visualized portions of the skeleton. CT ABDOMEN PELVIS FINDINGS Hepatobiliary: No definite suspicious cystic or solid hepatic lesions. No intra or extrahepatic biliary ductal dilatation. Partially calcified gallstone in the gallbladder measuring up to 2 cm in diameter. Gallbladder is otherwise  unremarkable in appearance. Pancreas: No pancreatic mass. No pancreatic ductal dilatation. No pancreatic or peripancreatic fluid collections or inflammatory changes. Spleen: Unremarkable. Adrenals/Urinary Tract: Exophytic high attenuation (74 HU) lesion with peripheral calcification extending off the posterolateral aspect of the upper pole of the left kidney, incompletely characterized,  but favored to represent a proteinaceous/hemorrhagic cyst. Several other small renal lesions ranging from low to low-intermediate attenuation, likely to represent additional cysts and mildly proteinaceous cysts (no imaging follow-up recommended). No clearly aggressive appearing renal lesions are noted. No hydroureteronephrosis. Urinary bladder is unremarkable in appearance. Bilateral adrenal glands are normal in appearance. Stomach/Bowel: The appearance of the stomach is normal. No pathologic dilatation of small bowel or colon. Numerous colonic diverticula are noted, without definite focal inflammatory changes to suggest an acute diverticulitis at this time. The appendix is not confidently identified and may be surgically absent. Regardless, there are no inflammatory changes noted adjacent to the cecum to suggest the presence of an acute appendicitis at this time. Vascular/Lymphatic: Atherosclerosis throughout the abdominal aorta and pelvic vasculature, without definite aneurysm or dissection. No definite lymphadenopathy confidently identified in the abdomen or pelvis. Reproductive: Uterus is atrophic. Ovaries are prominent bilaterally measuring 3.5 x 3.2 x 4.0 cm (61 HU) on the right (axial image 103 of series 2 and coronal image 28 of series 8), and 3.3 x 1.9 x 2.3 cm on the left (14 HU) on axial image 99 of series 2 and coronal image 50 of series 8). Other: Trace volume of ascites.  No pneumoperitoneum. Musculoskeletal: Nondisplaced fractures of the inferior pubic rami bilaterally, left pubic bone in the parasymphyseal region, and  the sacral ala bilaterally. There are no aggressive appearing lytic or blastic lesions noted in the visualized portions of the skeleton. IMPRESSION: 1. Aggressive appearing mass which appears likely to arise from the medial left upper lobe and is highly invasive into the mediastinum with evidence of pericardial invasion, probable malignant left pleural effusion, and pulmonary nodules in the contralateral lung, as detailed above. This mass is estimated to measure approximately 7.1 x 5.9 x 6.9 cm, and is highly concerning for primary bronchogenic neoplasm, although alternative etiologies arising directly from the mediastinum should also be considered. Tissue sampling is advised to establish a tissue diagnosis. Further evaluation with PET-CT should also be considered to assess for additional metastatic disease. 2. Additionally, the ovaries are prominent bilaterally, and the possibility of metastatic lesions to the ovaries is not excluded. Alternatively, primary ovarian malignancy should also be considered. Further evaluation with pelvic ultrasound is recommended in the near future to better evaluate these findings. 3. Multiple nondisplaced pelvic fractures, as above. 4. Aortic atherosclerosis, in addition to left anterior descending coronary artery disease. 5. Diffuse bronchial wall thickening with moderate to severe centrilobular and paraseptal emphysema. 6. Cholelithiasis without evidence of acute cholecystitis. 7. Additional incidental findings, as above. Electronically Signed   By: Trudie Reed M.D.   On: 07/30/2022 07:01   DG Pelvis Portable  Result Date: 07/20/2022 CLINICAL DATA:  Fall EXAM: PORTABLE PELVIS 1-2 VIEWS COMPARISON:  None Available. FINDINGS: Normal alignment. No acute fracture or dislocation. Lucency overlying the left intratrochanteric region likely relates to a skin fold. Soft tissues are unremarkable. IMPRESSION: 1. No acute fracture or dislocation. Electronically Signed   By: Helyn Numbers M.D.   On: 07/26/2022 03:17   DG Chest Port 1 View  Result Date: 07/22/2022 CLINICAL DATA:  Fall EXAM: PORTABLE CHEST 1 VIEW COMPARISON:  None Available. FINDINGS: There is asymmetric rounded density within the left hilum suspicious for left hilar adenopathy or and a left hilar mass. Moderate left pleural effusion is present. Associated left basilar atelectasis noted. The right lung appears hyperinflated suggesting changes of superimposed COPD. No pneumothorax. No pleural effusion on the right. Cardiac size within normal limits. No  acute bone abnormality. IMPRESSION: 1. Asymmetric rounded density within the left hilum suspicious for left hilar adenopathy or a left hilar mass. Associated moderate left pleural effusion. This could be further assessed with contrast enhanced CT imaging. Electronically Signed   By: Helyn Numbers M.D.   On: 08/04/2022 03:15    Procedures Procedures    Medications Ordered in ED Medications  lactated ringers infusion ( Intravenous New Bag/Given 07/17/2022 0645)  metroNIDAZOLE (FLAGYL) IVPB 500 mg (500 mg Intravenous New Bag/Given 08/04/2022 0634)  vancomycin (VANCOREADY) IVPB 750 mg/150 mL (750 mg Intravenous New Bag/Given 08/01/2022 0640)  lactated ringers bolus 1,000 mL (0 mLs Intravenous Stopped 07/22/2022 0636)  ceFEPIme (MAXIPIME) 2 g in sodium chloride 0.9 % 100 mL IVPB (0 g Intravenous Stopped 07/31/2022 0631)  iohexol (OMNIPAQUE) 300 MG/ML solution 75 mL (75 mLs Intravenous Contrast Given 07/25/2022 0617)    ED Course/ Medical Decision Making/ A&P                             Medical Decision Making Amount and/or Complexity of Data Reviewed Labs: ordered. Radiology: ordered.  Risk Prescription drug management.   Presents to the emergency department after a fall.  Patient reports that she fell because her legs gave out on her.  This is never happened before.  Normally she ambulates without difficulty.  She had difficulty getting up and then fell multiple times  afterwards because the legs keep giving out.  Initially orthopedic injury was considered to be the most likely diagnosis.  She was, however, noted to be slightly hypothermic on arrival.  She was also tachycardic.  A chest x-ray and pelvic x-ray performed at arrival because of the fall showed effusion and possible mass in the lungs.  Broader workup was therefore performed including workup for sepsis and metastatic cancer.  Patient with significant leukocytosis.  She was given broad-spectrum antibiotic coverage based on this and her tachycardia, multiple SIRS criteria.  CT chest, abdomen, pelvis performed to further evaluate for source of infection as well as to further evaluate what was felt to be lung mass.  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  Patient does have bilateral enlarged ovaries, ovarian cancer considered possible as well.  CT of pelvis does show fractures of the  inferior pubic rami bilaterally, left pubic bone in the parasymphyseal region, and the sacral ala bilaterally.   CRITICAL CARE Performed by: Gilda Crease   Total critical care time: 35 minutes  Critical care time was exclusive of separately billable procedures and treating other patients.  Critical care was necessary to treat or prevent imminent or life-threatening deterioration.  Critical care was time spent personally by me on the following activities: development of treatment plan with patient and/or surrogate as well as nursing, discussions with consultants, evaluation of patient's response to treatment, examination of patient, obtaining history from patient or surrogate, ordering and performing treatments and interventions, ordering and review of laboratory studies, ordering and review of radiographic studies, pulse oximetry and re-evaluation of patient's condition.   Addendum: Discussed CODE STATUS with patient.  She confirms that  she does not want CPR, no intubation, she is a DNR.     Final Clinical Impression(s) / ED Diagnoses Final diagnoses:  Lung mass  Multiple closed fractures of pelvis without disruption of pelvic ring, initial encounter (HCC)    Rx / DC Orders ED Discharge Orders  None         Gilda Crease, MD 07/26/2022 1610    Gilda Crease, MD 07/28/[id number]

## 2022-08-10 NOTE — Progress Notes (Signed)
A consult was received from an ED physician for vanc and cefepime per pharmacy dosing.  The patient's profile has been reviewed for ht/wt/allergies/indication/available labs.   A one time order has been placed for vanc 750mg  and cefepime 2gm.    Further antibiotics/pharmacy consults should be ordered by admitting physician if indicated.                       Thank you, Arley Phenix RPh 07/29/2022, 4:12 AM

## 2022-08-10 NOTE — Progress Notes (Signed)
Initial Nutrition Assessment  DOCUMENTATION CODES:   Underweight  INTERVENTION:  - Add Ensure Enlive po BID, each supplement provides 350 kcal and 20 grams of protein.  - Add Rena-vit q day.   NUTRITION DIAGNOSIS:   Inadequate oral intake related to inability to eat as evidenced by meal completion < 25%.  GOAL:   Patient will meet greater than or equal to 90% of their needs  MONITOR:   PO intake, Supplement acceptance  REASON FOR ASSESSMENT:   Consult Assessment of nutrition requirement/status  ASSESSMENT:   89 y.o. female admits related to fall. PMH includes: osteoporosis, anemia, CKD stage 3, dyslipidemia, HTN. Pt is currently receiving medical management related to acute respiratory failure with hypoxia.  Meds reviewed:  colace, rena-vit. Labs reviewed: BUN/Creatinine elevated.   No answer to room phone. Per record, pt ate 0% of her dinner due to nausea. Pt planning to discharge with hospice services. RD will add Ensure BID. RD will continue to monitor PO intakes. Per record, pt has lost 12% wt loss in 4 months. RD will continue to monitor POC.   NUTRITION - FOCUSED PHYSICAL EXAM:  Remote assessment.  Diet Order:   Diet Order             Diet regular Room service appropriate? Yes; Fluid consistency: Thin  Diet effective now                   EDUCATION NEEDS:   Not appropriate for education at this time  Skin:  Skin Assessment: Reviewed RN Assessment  Last BM:  6/30 - type 3  Height:   Ht Readings from Last 1 Encounters:  08/09/2022 5\' 3"  (1.6 m)    Weight:   Wt Readings from Last 1 Encounters:  07/29/2022 37.6 kg    Ideal Body Weight:     BMI:  Body mass index is 14.7 kg/m.  Estimated Nutritional Needs:   Kcal:  1200-1400 kcals  Protein:  60-70 gm  Fluid:  >/= 1.2 L  Bethann Humble, RD, LDN, CNSC.

## 2022-08-10 NOTE — Progress Notes (Signed)
Pharmacy Antibiotic Note  Erika Dean is a 89 y.o. female admitted on 07/14/2022 with weakness contributing to falls. 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 Pharmacy has been consulted for vancomycin and cefepime dosing for obstructive pneumonia in setting of above.  Plan: -Vancomycin 750 mg IV x 1 - follow with 500 mg IV q48h based on current renal function -Cefepime 2 g IV q24h -Continue to follow renal function, cultures and clinical progress for dose adjustments and de-escalation as indicated  Height: 5\' 3"  (160 cm) Weight: 37.6 kg (83 lb) IBW/kg (Calculated) : 52.4  Temp (24hrs), Avg:97.2 F (36.2 C), Min:96.5 F (35.8 C), Max:97.7 F (36.5 C)  Recent Labs  Lab 07/30/2022 0211 07/16/2022 0527 07/20/2022 0814  WBC 32.8*  --   --   CREATININE 1.14*  --   --   LATICACIDVEN  --  2.1* 1.6    Estimated Creatinine Clearance: 18.3 mL/min (A) (by C-G formula based on SCr of 1.14 mg/dL (H)).    Allergies  Allergen Reactions   Sulfa Antibiotics     Other reaction(s): vomiting/nausea    Antimicrobials this admission: Vancomycin 6/30 >> Cefepime 6/30 >> Metronidazole 6/30 x 1  Dose adjustments this admission: NA  Microbiology results: 6/30 BCx: pending 6/30 MRSA PCR: ordered  Thank you for allowing pharmacy to be a part of this patient's care.  Pricilla Riffle, PharmD, BCPS Clinical Pharmacist 07/22/2022 10:11 AM

## 2022-08-10 NOTE — Progress Notes (Signed)
   07/25/2022 1703  Assess: MEWS Score  Temp (!) 96.5 F (35.8 C) (difficulty getting temperature on pt today)  Pulse Rate 97  SpO2 92 %  O2 Device Room Air  Patient Activity (if Appropriate) In bed  O2 Flow Rate (L/min) 0 L/min  Assess: MEWS Score  MEWS Temp 1  MEWS Systolic 1  MEWS Pulse 0  MEWS RR 0  MEWS LOC 0  MEWS Score 2  MEWS Score Color Yellow  Assess: if the MEWS score is Yellow or Red  Were vital signs taken at a resting state? Yes  Focused Assessment No change from prior assessment  Does the patient meet 2 or more of the SIRS criteria? Yes  Does the patient have a confirmed or suspected source of infection? No  MEWS guidelines implemented  Yes, yellow  Treat  MEWS Interventions Considered administering scheduled or prn medications/treatments as ordered  Take Vital Signs  Increase Vital Sign Frequency  Yellow: Q2hr x1, continue Q4hrs until patient remains green for 12hrs  Escalate  MEWS: Escalate Yellow: Discuss with charge nurse and consider notifying provider and/or RRT  Notify: Charge Nurse/RN  Name of Charge Nurse/RN Notified [facility]  Provider Notification  Provider Name/Title Dr Hanley Ben  Date Provider Notified 07/22/2022  Time Provider Notified 1740  Method of Notification Page  Notification Reason Other (Comment) (temp low)  Provider response No new orders  Date of Provider Response 07/30/2022  Time of Provider Response 1745  Assess: SIRS CRITERIA  SIRS Temperature  1  SIRS Pulse 1  SIRS Respirations  0  SIRS WBC 0  SIRS Score Sum  2

## 2022-08-10 NOTE — Progress Notes (Signed)
Pt being followed by [facility] for Sepsis protocol. 

## 2022-08-10 NOTE — ED Notes (Signed)
Unable to get Pt's O2 Sats>90 d/t generalized cold extremities. Pt doesn't report any SOB and no dyspnea is noted. Jaci Carrel, MD made aware. Pt remains on monitor, will continue to monitor for any respiratory distress.

## 2022-08-10 NOTE — Progress Notes (Signed)
  Echocardiogram 2D Echocardiogram has been performed.  Erika Dean 08/04/2022, 1:59 PM

## 2022-08-10 NOTE — Evaluation (Signed)
Physical Therapy Evaluation Patient Details Name: Erika Dean MRN: [id number] DOB: Oct 16, 1926 Today's Date: 07/16/2022  History of Present Illness  89 y.o. female with medical history significant of osteoporosis, anemia secondary to B12 deficiency, CKD stage III, dyslipidemia, hypertension, and chronic malnutrition BMI 14.7.  Patient lives at [facility] and was still driving.  Pt presented to ED 08/09/2022 after fall and inability to get up.  Pt found to have pelvic fractures : there were multiple nondisplaced fractures of the inferior pubic rami bilaterally, left pubic bone and the sacral alla bilaterally.  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  Clinical Impression  Pt admitted with above diagnosis.  Pt currently with functional limitations due to the deficits listed below (see PT Problem List). Pt will benefit from acute skilled PT to increase their independence and safety with mobility to allow discharge.  Pt agreeable to attempt OOB for transfer to [facility] as she really wanted to have BM.  Pt unable to tolerate sitting EOB and requested return to supine (assisted with placing bed pain - pt able to bridge due to rolling very painful).  Pt from ILF and will likely require more assist upon d/c.  Pt would benefit from palliative care consult.        Recommendations for follow up therapy are one component of a multi-disciplinary discharge planning process, led by the attending physician.  Recommendations may be updated based on patient status, additional functional criteria and insurance authorization.  Follow Up Recommendations Can patient physically be transported by private vehicle: No     Assistance Recommended at Discharge Intermittent Supervision/Assistance  Patient can return home with the following  A little help with walking  and/or transfers;A little help with bathing/dressing/bathroom;Assistance with cooking/housework;Assist for transportation;Help with stairs or ramp for entrance    Equipment Recommendations Rolling walker (2 wheels)  Recommendations for Other Services       Functional Status Assessment Patient has had a recent decline in their functional status and demonstrates the ability to make significant improvements in function in a reasonable and predictable amount of time.     Precautions / Restrictions Precautions Precautions: Fall Precaution Comments: PT ordered for bil pelvic fxs per notes, no weight bearing restrictions in orders      Mobility  Bed Mobility Overal bed mobility: Needs Assistance Bed Mobility: Supine to Sit, Sit to Supine     Supine to sit: Max assist, +2 for safety/equipment Sit to supine: Max assist, +2 for safety/equipment   General bed mobility comments: cues for technique, assist due to pain in pelvis, unable to tolerate sitting EOB and requested return to supine    Transfers                        Ambulation/Gait                  Stairs            Wheelchair Mobility    Modified Rankin (Stroke Patients Only)       Balance Overall balance assessment: History of Falls                                           Pertinent Vitals/Pain Pain  Assessment Pain Assessment: Faces Faces Pain Scale: Hurts whole lot Pain Location: pelvis Pain Descriptors / Indicators: Sore, Guarding, Grimacing Pain Intervention(s): Repositioned, Monitored during session    Home Living Family/patient expects to be discharged to:: Private residence     Type of Home: Independent living facility           Home Equipment: None      Prior Function Prior Level of Function : Independent/Modified Independent             Mobility Comments: pt and daughter report independence although daughter states her memory has been poor        Hand Dominance        Extremity/Trunk Assessment        Lower Extremity Assessment Lower Extremity Assessment: Generalized weakness;RLE deficits/detail;LLE deficits/detail RLE: Unable to fully assess due to pain LLE: Unable to fully assess due to pain       Communication   Communication: HOH  Cognition Arousal/Alertness: Awake/alert Behavior During Therapy: Flat affect, WFL for tasks assessed/performed Overall Cognitive Status: History of cognitive impairments - at baseline                                 General Comments: daughter reports pt has memory issues and poor knowledge of medical terminology - requests speaking plainly and simply to pt        General Comments      Exercises     Assessment/Plan    PT Assessment Patient needs continued PT services  PT Problem List Decreased strength;Decreased activity tolerance;Decreased balance;Decreased mobility;Decreased knowledge of use of DME;Pain       PT Treatment Interventions DME instruction;Gait training;Balance training;Therapeutic exercise;Functional mobility training;Patient/family education;Therapeutic activities    PT Goals (Current goals can be found in the Care Plan section)  Acute Rehab PT Goals PT Goal Formulation: With patient/family Time For Goal Achievement: 08/24/22 Potential to Achieve Goals: Good    Frequency Min 1X/week     Co-evaluation               AM-PAC PT "6 Clicks" Mobility  Outcome Measure Help needed turning from your back to your side while in a flat bed without using bedrails?: A Lot Help needed moving from lying on your back to sitting on the side of a flat bed without using bedrails?: Total Help needed moving to and from a bed to a chair (including a wheelchair)?: Total Help needed standing up from a chair using your arms (e.g., wheelchair or bedside chair)?: Total Help needed to walk in hospital room?: Total Help needed climbing 3-5 steps with a  railing? : Total 6 Click Score: 7    End of Session Equipment Utilized During Treatment: Gait belt Activity Tolerance: Patient limited by pain Patient left: in bed;with call bell/phone within reach;with bed alarm set Nurse Communication: Mobility status PT Visit Diagnosis: Difficulty in walking, not elsewhere classified (R26.2);Pain Pain - part of body:  (pelvis)    Time: 1440-1500 PT Time Calculation (min) (ACUTE ONLY): 20 min   Charges:   PT Evaluation $PT Eval Low Complexity: 1 Low        Kati PT, DPT Physical Therapist Acute Rehabilitation Services Office: [phone]   Janan Halter Payson 07/22/2022, 3:53 PM

## 2022-08-10 NOTE — ED Triage Notes (Signed)
Pt BIBA from [facility] after experiencing new onset of generalized weakness to BLE and unable to bear weight to her legs. Pt states she tried on 6 different occasions to stand up and each time continued to fall. Denies LOC, blood thinners, or hitting her head.No head lacs or abrasions noted.

## 2022-08-10 NOTE — Consult Note (Signed)
NAME:  Erika Dean, MRN:  [id number], DOB:  Jun 19, 1926, LOS: 0 ADMISSION DATE:  07/16/2022, CONSULTATION DATE: 08/01/2022 REFERRING MD: TRH, CHIEF COMPLAINT: Falls  History of Present Illness:  This is a very pleasant 89 year old woman with a distant history of smoking who is presenting with progressive weakness and ambulatory failure for the past day.  Workup has revealed abnormal chest imaging for which PCCM was consulted.  The patient shares with me she lost her husband in January has been losing weight since.  Does have some mild shortness of breath insidious in onset.  Pertinent  Medical History  Osteoporosis Hypertension CKD Severe protein calorie malnutrition present on admission  Significant Hospital Events: Including procedures, antibiotic start and stop dates in addition to other pertinent events   6/30 admission, PCCM, hospice consult  Interim History / Subjective:  Consult  Objective   Blood pressure (!) 176/76, pulse 82, temperature 97.7 F (36.5 C), temperature source Oral, resp. rate (!) 22, height 5\' 3"  (1.6 m), weight 37.6 kg, SpO2 (!) 84 %.        Intake/Output Summary (Last 24 hours) at 07/17/2022 1121 Last data filed at 07/24/2022 0915 Gross per 24 hour  Intake 1358.89 ml  Output 1 ml  Net 1357.89 ml   Filed Weights   08/08/2022 0816  Weight: 37.6 kg    Examination: General: Very pleasant frail elderly woman in no acute distress HENT: Temporal wasting, good dentition Lungs: Diminished left base, no accessory muscle use, breathing comfortably on room air Cardiovascular: Heart sounds are regular, extremities warm Abdomen: Abdomen is soft, positive bowel sounds Extremities: Muscle wasting Neuro: Moves all 4 extremities to command, globally weak Skin: Age-related changes  Resolved Hospital Problem list   Not applicable  Assessment & Plan:  Likely stage IV lung cancer Severe protein calorie malnutrition, failure to thrive, frail elderly Ambulatory  failure  Spent a good deal of time talking about the potential paths forward including biopsy and consideration for chemo versus immunotherapy.  We spoke about this with daughter on the phone.  Patient is more inclined to focus on being comfortable and is interested in hospice services.  For this reason, will not pursue thoracentesis or biopsy, recommend hospice consult.  She and her daughter seemed at peace with this.  Thank you for the consultation.  Pulmonary critical care will be available as needed.  Best Practice (right click and "Reselect all SmartList Selections" daily)  Per primary  Labs   CBC: Recent Labs  Lab 07/24/2022 0211  WBC 32.8*  NEUTROABS 29.5*  HGB 10.7*  HCT 37.1  MCV 79.6*  PLT 426*    Basic Metabolic Panel: Recent Labs  Lab 07/21/2022 0211  NA 137  K 4.7  CL 101  CO2 24  GLUCOSE 148*  BUN 30*  CREATININE 1.14*  CALCIUM 9.4   GFR: Estimated Creatinine Clearance: 18.3 mL/min (A) (by C-G formula based on SCr of 1.14 mg/dL (H)). Recent Labs  Lab 08/09/2022 0211 08/03/2022 0527 08/04/2022 0814  WBC 32.8*  --   --   LATICACIDVEN  --  2.1* 1.6    Liver Function Tests: Recent Labs  Lab 08/02/2022 0211  AST 35  ALT 49*  ALKPHOS 94  BILITOT 0.8  PROT 6.4*  ALBUMIN 2.6*   No results for input(s): "LIPASE", "AMYLASE" in the last 168 hours. No results for input(s): "AMMONIA" in the last 168 hours.  ABG    Component Value Date/Time   PHART 7.41 07/23/2022 0430   PCO2ART  40 07/27/2022 0430   PO2ART 40 (LL) 08/03/2022 0430   HCO3 25.4 07/23/2022 0430   O2SAT 71.8 07/15/2022 0430     Coagulation Profile: No results for input(s): "INR", "PROTIME" in the last 168 hours.  Cardiac Enzymes: No results for input(s): "CKTOTAL", "CKMB", "CKMBINDEX", "TROPONINI" in the last 168 hours.  HbA1C: No results found for: "HGBA1C"  CBG: No results for input(s): "GLUCAP" in the last 168 hours.  Review of Systems:    Positive Symptoms in  bold:  Constitutional fevers, chills, weight loss, fatigue, anorexia, malaise  Eyes decreased vision, double vision, eye irritation  Ears, Nose, Mouth, Throat sore throat, trouble swallowing, sinus congestion  Cardiovascular chest pain, paroxysmal nocturnal dyspnea, lower ext edema, palpitations   Respiratory SOB, cough, DOE, hemoptysis, wheezing  Gastrointestinal nausea, vomiting, diarrhea  Genitourinary burning with urination, trouble urinating  Musculoskeletal joint aches, joint swelling, back pain  Integumentary  rashes, skin lesions  Neurological focal weakness, focal numbness, trouble speaking, headaches  Psychiatric depression, anxiety, confusion  Endocrine polyuria, polydipsia, cold intolerance, heat intolerance  Hematologic abnormal bruising, abnormal bleeding, unexplained nose bleeds  Allergic/Immunologic recurrent infections, hives, swollen lymph nodes     Past Medical History:  She,  has a past medical history of Age-related osteoporosis without current pathological fracture, B12 deficiency, Chronic kidney disease, stage 3 (HCC), Hypercholesteremia, Hypertension with renal disease, Macrocytic anemia, Malnutrition, calorie (HCC), and Sleep disorder.   Surgical History:  History reviewed. No pertinent surgical history.   Social History:   reports that she has quit smoking. Her smoking use included cigarettes. She has never used smokeless tobacco.   Family History:  Her family history is negative for Breast cancer.   Allergies Allergies  Allergen Reactions   Sulfa Antibiotics     Other reaction(s): vomiting/nausea     Home Medications  Prior to Admission medications   Medication Sig Start Date End Date Taking? Authorizing Provider  benazepril-hydrochlorthiazide (LOTENSIN HCT) 20-12.5 MG tablet TAKE ONE TABLET EACH DAY Patient taking differently: Take 1 tablet by mouth daily. 08/08/21  Yes Mahlon Gammon, MD  Glucosamine-Chondroitin (GLUCOSAMINE CHONDR COMPLEX PO)  Take 1 tablet by mouth daily.   Yes [provider]  ipratropium (ATROVENT) 0.03 % nasal spray TWO SPRAYS INTO EACH NOSTRIL EVERY DAY Patient taking differently: Place 2 sprays into both nostrils daily. 06/23/22  Yes Mahlon Gammon, MD  temazepam (RESTORIL) 30 MG capsule TAKE ONE CAPSULE AT BEDTIME DAILY Patient taking differently: Take 30 mg by mouth at bedtime. 07/04/22  Yes Wert, Trula Ore, NP  vitamin B-12 (CYANOCOBALAMIN) 100 MCG tablet Take 100 mcg by mouth daily. 08/13/15  Yes [provider]  vitamin C (ASCORBIC ACID) 500 MG tablet Take 500 mg by mouth daily.   Yes [provider]  VITAMIN D PO Take 1 tablet by mouth daily.   Yes [provider]     Critical care time: N/A

## 2022-08-10 NOTE — TOC Initial Note (Signed)
Transition of Care [facility]) - Initial/Assessment Note    Patient Details  Name: Erika Dean MRN: [id number] Date of Birth: Jul 09, 1926  [facility] CM/SW Contact:    Amada Jupiter, LCSW Phone Number: 07/24/2022, 3:30 PM  Clinical Narrative:                  Met with pt and daughter today to review for dc planning needs.  Alerted by MD of need for hospice/ palliative referral.  Both confirm that [place] a [place] of [facility].  Discussed with both that she will likely need SNF/ rehab at discharge with her given fxs/ limitations and pain management needs.  Pt reluctant to SNF but daughter understands this will be needed.  Re: hospice referral, pt appears a little confused with this idea and states, "I don't know why I need that".  Daughter fully understands need for palliative/ hospice for pain management needs and eventual end of life care - agreeable with referral to [facility] to follow - done.  TOC will reach out to [facility] to confirm availability of SNF bed and continue to follow to assist with this transition.  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[employer]) Lives with:: Self Patient language and need for interpreter reviewed:: Yes        Need for Family Participation in Patient Care: No (Comment) Care giver support system in place?: Yes (comment)   Criminal  Activity/Legal Involvement Pertinent to Current Situation/Hospitalization: No - Comment as needed  Activities of Daily Living      Permission Sought/Granted Permission sought to share information with : Family Supports, Magazine features editor Permission granted to share information with : Yes, Verbal Permission Granted  Share Information with NAME: daughter, Erika Dean @ [phone]  Permission granted to share info w AGENCY: [facility]        Emotional Assessment Appearance:: Appears stated age Attitude/Demeanor/Rapport: Gracious Affect (typically observed): Accepting Orientation: : Oriented to Self, Oriented to Place, Oriented to  Time, Oriented to Situation Alcohol / Substance Use: Not Applicable Psych Involvement: No (comment)  Admission diagnosis:  Lung mass [R91.8] Acute respiratory failure with hypoxia (HCC) [J96.01] Multiple closed fractures of pelvis without disruption of pelvic ring, initial encounter (HCC) [S32.82XA] Lung cancer (HCC) [C34.90] Patient Active Problem List   Diagnosis Date Noted   Acute respiratory failure with hypoxia (HCC) 07/28/2022   Lung cancer (HCC) 07/22/2022   Age-related osteoporosis without current pathological fracture 09/23/2021   Primary hypertension 09/23/2021   Localized edema 09/23/2021   Stage 3b chronic kidney disease (HCC) 09/23/2021   Primary insomnia 09/23/2021   Chronic rhinitis 09/23/2021   Macrocytic anemia 09/23/2021   B12 deficiency 09/23/2021   PCP:  Mahlon Gammon, MD Pharmacy:  [facility] - [place], [place] - [address] [place] [address] Phone: [phone] Fax: [phone]     Social Determinants of Health (SDOH) Social History: SDOH Screenings   Depression [phone]): Low Risk  (08/04/2022)  Tobacco Use: Medium Risk (08/02/2022)   SDOH Interventions:     Readmission Risk Interventions    07/14/2022    3:26 PM  Readmission Risk Prevention Plan  Post  Dischage Appt Complete  Medication Screening Complete  Transportation Screening Complete

## 2022-08-10 NOTE — ED Notes (Signed)
ED TO INPATIENT HANDOFF REPORT  ED Nurse Name and Phone #: [phone]   S Name/Age/Gender Erika Dean 89 y.o. female Room/Bed: WA17/WA17  Code Status   Code Status: DNR  Home/SNF/Other Skilled nursing facility [facility] Patient oriented to: self, place, time, and situation Is this baseline? Yes   Triage Complete: Triage complete  Chief Complaint sacral pain  Triage Note Pt BIBA from [facility] after experiencing new onset of generalized weakness to BLE and unable to bear weight to her legs. Pt states she tried on 6 different occasions to stand up and each time continued to fall. Denies LOC, blood thinners, or hitting her head.No head lacs or abrasions noted.   Allergies Allergies  Allergen Reactions   Sulfa Antibiotics     Other reaction(s): vomiting/nausea    Level of Care/Admitting Diagnosis ED Disposition     None       B Medical/Surgery History Past Medical History:  Diagnosis Date   Age-related osteoporosis without current pathological fracture    B12 deficiency    Chronic kidney disease, stage 3 (HCC)    Hypercholesteremia    Hypertension with renal disease    Macrocytic anemia    Malnutrition, calorie (HCC)    Sleep disorder    History reviewed. No pertinent surgical history.   A IV Location/Drains/Wounds Patient Lines/Drains/Airways Status     Active Line/Drains/Airways     Name Placement date Placement time Site Days   Peripheral IV 07/26/2022 20 G Anterior;Right Forearm 08/04/2022  0228  Forearm  less than 1   Peripheral IV 07/18/2022 20 G Anterior;Left Forearm 07/14/2022  0528  Forearm  less than 1            Intake/Output Last 24 hours  Intake/Output Summary (Last 24 hours) at 08/06/2022 0805 Last data filed at 07/13/2022 0805 Gross per 24 hour  Intake 1358.89 ml  Output --  Net 1358.89 ml    Labs/Imaging Results for orders placed or performed during the hospital encounter of 08/01/2022 (from the past 48 hour(s))  CBC with  Differential/Platelet     Status: Abnormal   Collection Time: 07/30/2022  2:11 AM  Result Value Ref Range   WBC 32.8 (H) 4.0 - 10.5 K/uL   RBC 4.66 3.87 - 5.11 MIL/uL   Hemoglobin 10.7 (L) 12.0 - 15.0 g/dL   HCT 45.4 09.8 - 11.9 %   MCV 79.6 (L) 80.0 - 100.0 fL   MCH 23.0 (L) 26.0 - 34.0 pg   MCHC 28.8 (L) 30.0 - 36.0 g/dL   RDW 14.7 82.9 - 56.2 %   Platelets 426 (H) 150 - 400 K/uL   nRBC 0.0 0.0 - 0.2 %   Neutrophils Relative % 91 %   Neutro Abs 29.5 (H) 1.7 - 7.7 K/uL   Lymphocytes Relative 2 %   Lymphs Abs 0.8 0.7 - 4.0 K/uL   Monocytes Relative 5 %   Monocytes Absolute 1.6 (H) 0.1 - 1.0 K/uL   Eosinophils Relative 0 %   Eosinophils Absolute 0.0 0.0 - 0.5 K/uL   Basophils Relative 0 %   Basophils Absolute 0.1 0.0 - 0.1 K/uL   Immature Granulocytes 2 %   Abs Immature Granulocytes 0.76 (H) 0.00 - 0.07 K/uL    Comment: Performed at [facility], [address], [place], [place] [address]  Comprehensive metabolic panel     Status: Abnormal   Collection Time: 07/31/2022  2:11 AM  Result Value Ref Range   Sodium 137 135 -  145 mmol/L   Potassium 4.7 3.5 - 5.1 mmol/L   Chloride 101 98 - 111 mmol/L   CO2 24 22 - 32 mmol/L   Glucose, Bld 148 (H) 70 - 99 mg/dL    Comment: Glucose reference range applies only to samples taken after fasting for at least 8 hours.   BUN 30 (H) 8 - 23 mg/dL   Creatinine, Ser 9.60 (H) 0.44 - 1.00 mg/dL   Calcium 9.4 8.9 - 45.4 mg/dL   Total Protein 6.4 (L) 6.5 - 8.1 g/dL   Albumin 2.6 (L) 3.5 - 5.0 g/dL   AST 35 15 - 41 U/L   ALT 49 (H) 0 - 44 U/L   Alkaline Phosphatase 94 38 - 126 U/L   Total Bilirubin 0.8 0.3 - 1.2 mg/dL   GFR, Estimated 45 (L) >60 mL/min    Comment: (NOTE) Calculated using the CKD-EPI Creatinine Equation (2021)    Anion gap 12 5 - 15    Comment: Performed at [facility], [address] [place], [place] [address]  Blood gas, arterial (at [facility])     Status: Abnormal   Collection Time:  07/19/2022  4:30 AM  Result Value Ref Range   FIO2 21 %   Delivery systems ROOM AIR    RATE 18 resp/min   pH, Arterial 7.41 7.35 - 7.45   pCO2 arterial 40 32 - 48 mmHg   pO2, Arterial 40 (LL) 83 - 108 mmHg    Comment: CRITICAL RESULT CALLED TO, READ BACK BY AND VERIFIED WITH: GRAY,S. RN AT [phone] 07/16/2022 MULLINS,T    Bicarbonate 25.4 20.0 - 28.0 mmol/L   Acid-Base Excess 0.3 0.0 - 2.0 mmol/L   O2 Saturation 71.8 %   Patient temperature 35.8    Collection site RIGHT BRACHIAL    Drawn by [id number]    Allens test (pass/fail) PASS PASS    Comment: Performed at [facility], [address], [place], [place] [address]  Lactic acid, plasma     Status: Abnormal   Collection Time: 07/22/2022  5:27 AM  Result Value Ref Range   Lactic Acid, Venous 2.1 (HH) 0.5 - 1.9 mmol/L    Comment: CRITICAL RESULT CALLED TO, READ BACK BY AND VERIFIED WITH RIMANDO,J. RN AT [phone] 08/08/2022 MULLINS,T Performed at [facility], [address] [place], [place] [address]    CT CHEST ABDOMEN PELVIS W CONTRAST  Result Date: 07/13/2022 CLINICAL DATA:  89 year old female with history of new onset of generalized weakness. Unable to bear weight. Chest mass. Evaluate for primary tumor and metastatic disease. * Tracking Code: BO * EXAM: CT CHEST, ABDOMEN, AND PELVIS WITH CONTRAST TECHNIQUE: Multidetector CT imaging of the chest, abdomen and pelvis was performed following the standard protocol during bolus administration of intravenous contrast. RADIATION DOSE REDUCTION: This exam was performed according to the departmental dose-optimization program which includes automated exposure control, adjustment of the mA and/or kV according to patient size and/or use of iterative reconstruction technique. CONTRAST:  75mL OMNIPAQUE IOHEXOL 300 MG/ML  SOLN COMPARISON:  Chest x-ray 08/04/2022. FINDINGS: CT CHEST FINDINGS Cardiovascular: Heart size is normal. Small amount of pericardial fluid and/or  thickening, unlikely to be of hemodynamic significance at this time. Mass intimately associated with the pericardium (discussed below), with several areas of mild pericardial enhancement and nodularity, best appreciated with the pericardium overlying the proximal ascending thoracic aorta (axial image 25 of series 2) where an enhancing soft tissue nodule measures up to 1.3  x 0.7 cm. Another prominent soft tissue nodule associated with the pericardium is located just to the left of the pulmonic valve (axial image 37 of series 2) measuring 1.3 x 0.9 cm. There is aortic atherosclerosis, as well as atherosclerosis of the great vessels of the mediastinum and the coronary arteries, including calcified atherosclerotic plaque in the left anterior descending coronary artery. Mediastinum/Nodes: Invasive centrally low-attenuation peripherally rim enhancing mass extending into the anterior mediastinum making broad contact with the pericardium overlying the proximal ascending thoracic aorta, pulmonic trunk and proximal left main pulmonary artery, and superior aspect of the heart, as detailed below. No definite pathologically enlarged mediastinal or hilar lymph nodes are confidently identified. Esophagus is unremarkable in appearance. Lungs/Pleura: Large mass in the left hemithorax, the epicenter of which is uncertain, but favored to be arising in the medial aspect of the left upper lobe (axial image 29 of series 2 and coronal image 77 of series 8) and estimated to measure 7.1 x 5.9 x 6.9 cm. This lesion is centrally low-attenuation with extensive irregular peripheral hyperenhancement, and appears highly invasive into the adjacent mediastinum where it makes intimate contact with adjacent vascular structures and pericardium, as detailed above. Some small pulmonary nodules are also noted in the right lung, largest of which is in the lateral segment of the right middle lobe (axial image 90 of series 6) measuring 6 x 7 mm. Large left  pleural effusion, likely malignant. Trace right pleural effusion. Areas of passive subsegmental atelectasis are noted in the lower lobes of the lungs bilaterally (left-greater-than-right). Diffuse bronchial wall thickening with moderate to severe centrilobular and paraseptal emphysema. Musculoskeletal: There are no aggressive appearing lytic or blastic lesions noted in the visualized portions of the skeleton. CT ABDOMEN PELVIS FINDINGS Hepatobiliary: No definite suspicious cystic or solid hepatic lesions. No intra or extrahepatic biliary ductal dilatation. Partially calcified gallstone in the gallbladder measuring up to 2 cm in diameter. Gallbladder is otherwise unremarkable in appearance. Pancreas: No pancreatic mass. No pancreatic ductal dilatation. No pancreatic or peripancreatic fluid collections or inflammatory changes. Spleen: Unremarkable. Adrenals/Urinary Tract: Exophytic high attenuation (74 HU) lesion with peripheral calcification extending off the posterolateral aspect of the upper pole of the left kidney, incompletely characterized, but favored to represent a proteinaceous/hemorrhagic cyst. Several other small renal lesions ranging from low to low-intermediate attenuation, likely to represent additional cysts and mildly proteinaceous cysts (no imaging follow-up recommended). No clearly aggressive appearing renal lesions are noted. No hydroureteronephrosis. Urinary bladder is unremarkable in appearance. Bilateral adrenal glands are normal in appearance. Stomach/Bowel: The appearance of the stomach is normal. No pathologic dilatation of small bowel or colon. Numerous colonic diverticula are noted, without definite focal inflammatory changes to suggest an acute diverticulitis at this time. The appendix is not confidently identified and may be surgically absent. Regardless, there are no inflammatory changes noted adjacent to the cecum to suggest the presence of an acute appendicitis at this time.  Vascular/Lymphatic: Atherosclerosis throughout the abdominal aorta and pelvic vasculature, without definite aneurysm or dissection. No definite lymphadenopathy confidently identified in the abdomen or pelvis. Reproductive: Uterus is atrophic. Ovaries are prominent bilaterally measuring 3.5 x 3.2 x 4.0 cm (61 HU) on the right (axial image 103 of series 2 and coronal image 28 of series 8), and 3.3 x 1.9 x 2.3 cm on the left (14 HU) on axial image 99 of series 2 and coronal image 50 of series 8). Other: Trace volume of ascites.  No pneumoperitoneum. Musculoskeletal: Nondisplaced fractures of the inferior pubic  rami bilaterally, left pubic bone in the parasymphyseal region, and the sacral ala bilaterally. There are no aggressive appearing lytic or blastic lesions noted in the visualized portions of the skeleton. IMPRESSION: 1. Aggressive appearing mass which appears likely to arise from the medial left upper lobe and is highly invasive into the mediastinum with evidence of pericardial invasion, probable malignant left pleural effusion, and pulmonary nodules in the contralateral lung, as detailed above. This mass is estimated to measure approximately 7.1 x 5.9 x 6.9 cm, and is highly concerning for primary bronchogenic neoplasm, although alternative etiologies arising directly from the mediastinum should also be considered. Tissue sampling is advised to establish a tissue diagnosis. Further evaluation with PET-CT should also be considered to assess for additional metastatic disease. 2. Additionally, the ovaries are prominent bilaterally, and the possibility of metastatic lesions to the ovaries is not excluded. Alternatively, primary ovarian malignancy should also be considered. Further evaluation with pelvic ultrasound is recommended in the near future to better evaluate these findings. 3. Multiple nondisplaced pelvic fractures, as above. 4. Aortic atherosclerosis, in addition to left anterior descending coronary artery  disease. 5. Diffuse bronchial wall thickening with moderate to severe centrilobular and paraseptal emphysema. 6. Cholelithiasis without evidence of acute cholecystitis. 7. Additional incidental findings, as above. Electronically Signed   By: Trudie Reed M.D.   On: 07/25/2022 07:01   DG Pelvis Portable  Result Date: 07/13/2022 CLINICAL DATA:  Fall EXAM: PORTABLE PELVIS 1-2 VIEWS COMPARISON:  None Available. FINDINGS: Normal alignment. No acute fracture or dislocation. Lucency overlying the left intratrochanteric region likely relates to a skin fold. Soft tissues are unremarkable. IMPRESSION: 1. No acute fracture or dislocation. Electronically Signed   By: Helyn Numbers M.D.   On: 07/17/2022 03:17   DG Chest Port 1 View  Result Date: 07/29/2022 CLINICAL DATA:  Fall EXAM: PORTABLE CHEST 1 VIEW COMPARISON:  None Available. FINDINGS: There is asymmetric rounded density within the left hilum suspicious for left hilar adenopathy or and a left hilar mass. Moderate left pleural effusion is present. Associated left basilar atelectasis noted. The right lung appears hyperinflated suggesting changes of superimposed COPD. No pneumothorax. No pleural effusion on the right. Cardiac size within normal limits. No acute bone abnormality. IMPRESSION: 1. Asymmetric rounded density within the left hilum suspicious for left hilar adenopathy or a left hilar mass. Associated moderate left pleural effusion. This could be further assessed with contrast enhanced CT imaging. Electronically Signed   By: Helyn Numbers M.D.   On: 08/07/2022 03:15    Pending Labs Unresulted Labs (From admission, onward)     Start     Ordered   07/20/2022 0406  Culture, blood (Routine X 2) w Reflex to ID Panel  BLOOD CULTURE X 2,   R (with STAT occurrences)      08/07/2022 0406   07/25/2022 0406  Urinalysis, w/ Reflex to Culture (Infection Suspected) -Urine, Clean Catch  Once,   URGENT       Question:  Specimen Source  Answer:  Urine, Clean Catch    07/25/2022 0406   07/30/2022 0138  Urinalysis, Routine w reflex microscopic -Urine, Clean Catch  ONCE - URGENT,   URGENT       Question:  Specimen Source  Answer:  Urine, Clean Catch   08/09/2022 0137            Vitals/Pain Today's Vitals   08/08/2022 0700 08/07/2022 0735 07/13/2022 0736 08/09/2022 0800  BP: 106/86   (!) 126/107  Pulse:  Marland Kitchen)  106  (!) 106  Resp: (!) 23 18  (!) 23  Temp:   97.7 F (36.5 C)   TempSrc:   Oral   SpO2:  100%  93%  PainSc:        Isolation Precautions No active isolations  Medications Medications  lactated ringers infusion ( Intravenous New Bag/Given 07/15/2022 0645)  lactated ringers bolus 1,000 mL (0 mLs Intravenous Stopped 08/05/2022 0636)  ceFEPIme (MAXIPIME) 2 g in sodium chloride 0.9 % 100 mL IVPB (0 g Intravenous Stopped 07/14/2022 0631)  metroNIDAZOLE (FLAGYL) IVPB 500 mg (0 mg Intravenous Stopped 07/25/2022 0735)  vancomycin (VANCOREADY) IVPB 750 mg/150 mL (0 mg Intravenous Stopped 08/05/2022 0805)  iohexol (OMNIPAQUE) 300 MG/ML solution 75 mL (75 mLs Intravenous Contrast Given 08/03/2022 0617)    Mobility Usually walks without any assistance but yesterday woke up and legs kept giving out now has multiple nondisplaced pelvic fx     Focused Assessments Pulmonary Assessment Handoff:  Lung sounds:   O2 Device: (S) Nasal Cannula (ABG obtained on room air, placed to 4 lpm n/c pending results.) O2 Flow Rate (L/min): 4 L/min  large lung mass on 5L [place]  Multiple nondisplaced pelvic fractures and possible primary ovarian cancer with mets.   Usually walks but fell 6 times yesterday.     R Recommendations: See Admitting Provider Note  Report given to:   Additional Notes: [id number]

## 2022-08-10 NOTE — ED Notes (Signed)
RN is aware of SPO2  and currently respiratory is in the room working with the patient.

## 2022-08-11 LAB — CULTURE, BLOOD (ROUTINE X 2)

## 2022-08-11 DEATH — deceased

## 2022-08-13 LAB — CULTURE, BLOOD (ROUTINE X 2): Special Requests: ADEQUATE

## 2022-08-14 LAB — CULTURE, BLOOD (ROUTINE X 2): Culture: NO GROWTH

## 2022-08-15 LAB — CULTURE, BLOOD (ROUTINE X 2): Culture: NO GROWTH

## 2022-09-11 NOTE — Progress Notes (Addendum)
When checking on patient, realized the patient was no longer breathing. Listened to her heart and there was no longer a heart beat. Charge RN verified time of passing at 459 am. Notified J. Garner Nash, NP.   Tried calling the daugher, Zuliana Dupont, at [phone]. She did not answer so I left a message for her to please call me.   0600 - Tried calling daughter again but unable to get in touch with her. Left number [phone] for her to please call me back.   2956 - Finally able to get in touch with the daughter and offering my condolences and get the information concerning the funeral home

## 2022-09-11 NOTE — Progress Notes (Addendum)
    OVERNIGHT PROGRESS REPORT  Notified by RN that patient is deceased as of 59 Hrs  Patient was DNR / Hospice track.  2 RN verified.  Family was not immediately available/contacted by RN.Chinita Greenland MSNA ACNPC-AG Acute Care Nurse Practitioner [facility] [place]

## 2022-09-11 NOTE — Discharge Summary (Signed)
Death Summary  Erika Dean ZOX:[id number] DOB: 1926-08-29 DOA: 09/02/22  PCP: Mahlon Gammon, MD  Admit date: 2022/09/02 Date of Death: 09/03/2022 Time of Death: 26   History of present illness:  89 y.o. female with medical history significant of osteoporosis, anemia secondary to B12 deficiency, CKD stage III, dyslipidemia, hypertension, and chronic malnutrition presented with a fall and recent weight loss. Imaging revealed large left lung mass with large left pleural effusion and some pericardial effusion along with WBCs of 32,800. She has been started on IV antibiotics and fluids. PCCM was consulted.  Patient decided to pursue hospice and daughter was agreeable with this.  She was supposed to be discharged to the facility with possible hospice but patient expired on 03-Sep-2022 at 0459.  Final Diagnoses:   New diagnosis of likely stage IV lung cancer with pleural effusion and pericardial effusion Acute respiratory failure with hypoxia Bilateral pelvic fractures Elevated lactic acid Severe protein calorie malnutrition-chronic kidney disease stage IIIb Hypertension Macrocytic anemia Dyslipidemia  The results of significant diagnostics from this hospitalization (including imaging, microbiology, ancillary and laboratory) are listed below for reference.    Significant Diagnostic Studies: ECHOCARDIOGRAM COMPLETE  Result Date: 09-02-2022    ECHOCARDIOGRAM REPORT   Patient Name:   Erika Dean Date of Exam: 09/02/2022 Medical Rec #:  [id number]  Height:       63.0 in Accession #:    [id number] Weight:       83.0 lb Date of Birth:  Feb 04, 1927  BSA:          1.331 m Patient Age:    89 years   BP:           176/76 mmHg Patient Gender: F          HR:           104 bpm. Exam Location:  Inpatient Procedure: 2D Echo, Cardiac Doppler and Color Doppler Indications:    Pericardial mass  History:        Patient has no prior history of Echocardiogram examinations.                 Risk Factors:Hypertension,  Dyslipidemia and Former Smoker.                 Chronic malnutrition, recent diagnosis of stage IV lung CA, CKD.  Sonographer:    Milda Smart Referring Phys: [id number] ALLISON L ELLIS  Sonographer Comments: Image acquisition challenging due to patient body habitus. IMPRESSIONS  1. Left ventricular ejection fraction, by estimation, is >75%. The left ventricle has hyperdynamic function. The left ventricle has no regional wall motion abnormalities. Indeterminate diastolic filling due to E-A fusion. There is the interventricular septum is flattened in systole and diastole, consistent with right ventricular pressure and volume overload.  2. Right ventricular systolic function is moderately reduced. The right ventricular size is moderately enlarged. There is severely elevated pulmonary artery systolic pressure.  3. The mitral valve is normal in structure. No evidence of mitral valve regurgitation. No evidence of mitral stenosis.  4. The tricuspid valve is abnormal. Tricuspid valve regurgitation is moderate.  5. The aortic valve is tricuspid. Aortic valve regurgitation is not visualized. No aortic stenosis is present.  6. The inferior vena cava is dilated in size with <50% respiratory variability, suggesting right atrial pressure of 15 mmHg. Comparison(s): No prior Echocardiogram. FINDINGS  Left Ventricle: Left ventricular ejection fraction, by estimation, is >75%. The left ventricle has hyperdynamic function. The left ventricle has no regional  wall motion abnormalities. The left ventricular internal cavity size was normal in size. There is no left ventricular hypertrophy. The interventricular septum is flattened in systole and diastole, consistent with right ventricular pressure and volume overload. Indeterminate diastolic filling due to E-A fusion. Right Ventricle: The right ventricular size is moderately enlarged. No increase in right ventricular wall thickness. Right ventricular systolic function is moderately reduced.  There is severely elevated pulmonary artery systolic pressure. The tricuspid regurgitant velocity is 3.49 m/s, and with an assumed right atrial pressure of 15 mmHg, the estimated right ventricular systolic pressure is 63.7 mmHg. Left Atrium: Left atrial size was normal in size. Right Atrium: Right atrial size was normal in size. Pericardium: There is no evidence of pericardial effusion. Mitral Valve: The mitral valve is normal in structure. No evidence of mitral valve regurgitation. No evidence of mitral valve stenosis. Tricuspid Valve: The tricuspid valve is abnormal. Tricuspid valve regurgitation is moderate . No evidence of tricuspid stenosis. Aortic Valve: The aortic valve is tricuspid. Aortic valve regurgitation is not visualized. No aortic stenosis is present. Pulmonic Valve: The pulmonic valve was grossly normal. Pulmonic valve regurgitation is mild to moderate. No evidence of pulmonic stenosis. Aorta: The aortic root and ascending aorta are structurally normal, with no evidence of dilitation. Venous: The inferior vena cava is dilated in size with less than 50% respiratory variability, suggesting right atrial pressure of 15 mmHg. IAS/Shunts: No atrial level shunt detected by color flow Doppler.  LEFT VENTRICLE PLAX 2D LVIDd:         2.40 cm     Diastology LVIDs:         1.40 cm     LV e' medial:  7.18 cm/s LV PW:         1.00 cm     LV e' lateral: 4.13 cm/s LV IVS:        1.00 cm LVOT diam:     1.80 cm LV SV:         49 LV SV Index:   37 LVOT Area:     2.54 cm  LV Volumes (MOD) LV vol d, MOD A2C: 18.4 ml LV vol d, MOD A4C: 8.1 ml LV vol s, MOD A2C: 3.9 ml LV vol s, MOD A4C: 1.9 ml LV SV MOD A2C:     14.5 ml LV SV MOD A4C:     8.1 ml LV SV MOD BP:      10.0 ml RIGHT VENTRICLE RV Basal diam:  3.30 cm RV Mid diam:    3.00 cm RV S prime:     7.40 cm/s TAPSE (M-mode): 1.4 cm LEFT ATRIUM             Index        RIGHT ATRIUM          Index LA diam:        1.90 cm 1.43 cm/m   RA Area:     9.87 cm LA Vol (A2C):    18.7 ml 14.05 ml/m  RA Volume:   24.30 ml 18.26 ml/m LA Vol (A4C):   7.0 ml  5.23 ml/m LA Biplane Vol: 10.0 ml 7.50 ml/m  AORTIC VALVE LVOT Vmax:   116.00 cm/s LVOT Vmean:  85.200 cm/s LVOT VTI:    0.193 m  AORTA Ao Root diam: 2.80 cm Ao Asc diam:  2.60 cm TRICUSPID VALVE TR Peak grad:   48.7 mmHg TR Mean grad:   32.0 mmHg TR Vmax:  349.00 cm/s TR Vmean:       272.0 cm/s  SHUNTS Systemic VTI:  0.19 m Systemic Diam: 1.80 cm Vishnu Priya Mallipeddi Electronically signed by Winfield Rast Mallipeddi Signature Date/Time: 08/07/2022/3:28:15 PM    Final    CT CHEST ABDOMEN PELVIS W CONTRAST  Result Date: 07/12/2022 CLINICAL DATA:  89 year old female with history of new onset of generalized weakness. Unable to bear weight. Chest mass. Evaluate for primary tumor and metastatic disease. * Tracking Code: BO * EXAM: CT CHEST, ABDOMEN, AND PELVIS WITH CONTRAST TECHNIQUE: Multidetector CT imaging of the chest, abdomen and pelvis was performed following the standard protocol during bolus administration of intravenous contrast. RADIATION DOSE REDUCTION: This exam was performed according to the departmental dose-optimization program which includes automated exposure control, adjustment of the mA and/or kV according to patient size and/or use of iterative reconstruction technique. CONTRAST:  75mL OMNIPAQUE IOHEXOL 300 MG/ML  SOLN COMPARISON:  Chest x-ray 07/14/2022. FINDINGS: CT CHEST FINDINGS Cardiovascular: Heart size is normal. Small amount of pericardial fluid and/or thickening, unlikely to be of hemodynamic significance at this time. Mass intimately associated with the pericardium (discussed below), with several areas of mild pericardial enhancement and nodularity, best appreciated with the pericardium overlying the proximal ascending thoracic aorta (axial image 25 of series 2) where an enhancing soft tissue nodule measures up to 1.3 x 0.7 cm. Another prominent soft tissue nodule associated with the pericardium is  located just to the left of the pulmonic valve (axial image 37 of series 2) measuring 1.3 x 0.9 cm. There is aortic atherosclerosis, as well as atherosclerosis of the great vessels of the mediastinum and the coronary arteries, including calcified atherosclerotic plaque in the left anterior descending coronary artery. Mediastinum/Nodes: Invasive centrally low-attenuation peripherally rim enhancing mass extending into the anterior mediastinum making broad contact with the pericardium overlying the proximal ascending thoracic aorta, pulmonic trunk and proximal left main pulmonary artery, and superior aspect of the heart, as detailed below. No definite pathologically enlarged mediastinal or hilar lymph nodes are confidently identified. Esophagus is unremarkable in appearance. Lungs/Pleura: Large mass in the left hemithorax, the epicenter of which is uncertain, but favored to be arising in the medial aspect of the left upper lobe (axial image 29 of series 2 and coronal image 77 of series 8) and estimated to measure 7.1 x 5.9 x 6.9 cm. This lesion is centrally low-attenuation with extensive irregular peripheral hyperenhancement, and appears highly invasive into the adjacent mediastinum where it makes intimate contact with adjacent vascular structures and pericardium, as detailed above. Some small pulmonary nodules are also noted in the right lung, largest of which is in the lateral segment of the right middle lobe (axial image 90 of series 6) measuring 6 x 7 mm. Large left pleural effusion, likely malignant. Trace right pleural effusion. Areas of passive subsegmental atelectasis are noted in the lower lobes of the lungs bilaterally (left-greater-than-right). Diffuse bronchial wall thickening with moderate to severe centrilobular and paraseptal emphysema. Musculoskeletal: There are no aggressive appearing lytic or blastic lesions noted in the visualized portions of the skeleton. CT ABDOMEN PELVIS FINDINGS Hepatobiliary: No  definite suspicious cystic or solid hepatic lesions. No intra or extrahepatic biliary ductal dilatation. Partially calcified gallstone in the gallbladder measuring up to 2 cm in diameter. Gallbladder is otherwise unremarkable in appearance. Pancreas: No pancreatic mass. No pancreatic ductal dilatation. No pancreatic or peripancreatic fluid collections or inflammatory changes. Spleen: Unremarkable. Adrenals/Urinary Tract: Exophytic high attenuation (74 HU) lesion with peripheral calcification extending off the  posterolateral aspect of the upper pole of the left kidney, incompletely characterized, but favored to represent a proteinaceous/hemorrhagic cyst. Several other small renal lesions ranging from low to low-intermediate attenuation, likely to represent additional cysts and mildly proteinaceous cysts (no imaging follow-up recommended). No clearly aggressive appearing renal lesions are noted. No hydroureteronephrosis. Urinary bladder is unremarkable in appearance. Bilateral adrenal glands are normal in appearance. Stomach/Bowel: The appearance of the stomach is normal. No pathologic dilatation of small bowel or colon. Numerous colonic diverticula are noted, without definite focal inflammatory changes to suggest an acute diverticulitis at this time. The appendix is not confidently identified and may be surgically absent. Regardless, there are no inflammatory changes noted adjacent to the cecum to suggest the presence of an acute appendicitis at this time. Vascular/Lymphatic: Atherosclerosis throughout the abdominal aorta and pelvic vasculature, without definite aneurysm or dissection. No definite lymphadenopathy confidently identified in the abdomen or pelvis. Reproductive: Uterus is atrophic. Ovaries are prominent bilaterally measuring 3.5 x 3.2 x 4.0 cm (61 HU) on the right (axial image 103 of series 2 and coronal image 28 of series 8), and 3.3 x 1.9 x 2.3 cm on the left (14 HU) on axial image 99 of series 2 and  coronal image 50 of series 8). Other: Trace volume of ascites.  No pneumoperitoneum. Musculoskeletal: Nondisplaced fractures of the inferior pubic rami bilaterally, left pubic bone in the parasymphyseal region, and the sacral ala bilaterally. There are no aggressive appearing lytic or blastic lesions noted in the visualized portions of the skeleton. IMPRESSION: 1. Aggressive appearing mass which appears likely to arise from the medial left upper lobe and is highly invasive into the mediastinum with evidence of pericardial invasion, probable malignant left pleural effusion, and pulmonary nodules in the contralateral lung, as detailed above. This mass is estimated to measure approximately 7.1 x 5.9 x 6.9 cm, and is highly concerning for primary bronchogenic neoplasm, although alternative etiologies arising directly from the mediastinum should also be considered. Tissue sampling is advised to establish a tissue diagnosis. Further evaluation with PET-CT should also be considered to assess for additional metastatic disease. 2. Additionally, the ovaries are prominent bilaterally, and the possibility of metastatic lesions to the ovaries is not excluded. Alternatively, primary ovarian malignancy should also be considered. Further evaluation with pelvic ultrasound is recommended in the near future to better evaluate these findings. 3. Multiple nondisplaced pelvic fractures, as above. 4. Aortic atherosclerosis, in addition to left anterior descending coronary artery disease. 5. Diffuse bronchial wall thickening with moderate to severe centrilobular and paraseptal emphysema. 6. Cholelithiasis without evidence of acute cholecystitis. 7. Additional incidental findings, as above. Electronically Signed   By: Trudie Reed M.D.   On: 08/03/2022 07:01   DG Pelvis Portable  Result Date: 07/26/2022 CLINICAL DATA:  Fall EXAM: PORTABLE PELVIS 1-2 VIEWS COMPARISON:  None Available. FINDINGS: Normal alignment. No acute fracture or  dislocation. Lucency overlying the left intratrochanteric region likely relates to a skin fold. Soft tissues are unremarkable. IMPRESSION: 1. No acute fracture or dislocation. Electronically Signed   By: Helyn Numbers M.D.   On: 07/20/2022 03:17   DG Chest Port 1 View  Result Date: 07/21/2022 CLINICAL DATA:  Fall EXAM: PORTABLE CHEST 1 VIEW COMPARISON:  None Available. FINDINGS: There is asymmetric rounded density within the left hilum suspicious for left hilar adenopathy or and a left hilar mass. Moderate left pleural effusion is present. Associated left basilar atelectasis noted. The right lung appears hyperinflated suggesting changes of superimposed COPD. No pneumothorax.  No pleural effusion on the right. Cardiac size within normal limits. No acute bone abnormality. IMPRESSION: 1. Asymmetric rounded density within the left hilum suspicious for left hilar adenopathy or a left hilar mass. Associated moderate left pleural effusion. This could be further assessed with contrast enhanced CT imaging. Electronically Signed   By: Helyn Numbers M.D.   On: 07/21/2022 03:15    Microbiology: Recent Results (from the past 240 hour(s))  Culture, blood (Routine X 2) w Reflex to ID Panel     Status: None (Preliminary result)   Collection Time: 07/26/2022  5:27 AM   Specimen: BLOOD  Result Value Ref Range Status   Specimen Description   Final    BLOOD BLOOD RIGHT HAND Performed at [facility], [address], [place], [place] [address]    Special Requests   Final    BOTTLES DRAWN AEROBIC AND ANAEROBIC Blood Culture adequate volume Performed at [facility], [address] [place], [place] [address]    Culture   Final    NO GROWTH 1 DAY Performed at [facility], [address], [place], [place] [address]    Report Status PENDING  Incomplete  Culture, blood (Routine X 2) w Reflex to ID Panel     Status: None (Preliminary result)   Collection Time: 07/15/2022   5:27 AM   Specimen: BLOOD  Result Value Ref Range Status   Specimen Description   Final    BLOOD BLOOD LEFT ARM Performed at [facility], [address], [place], [place] [address]    Special Requests   Final    BOTTLES DRAWN AEROBIC AND ANAEROBIC Blood Culture results may not be optimal due to an inadequate volume of blood received in culture bottles Performed at [facility], [address], [place], [place] [address]    Culture   Final    NO GROWTH 1 DAY Performed at [facility], [address] [place], [place] [address]    Report Status PENDING  Incomplete  MRSA Next Gen by PCR, Nasal     Status: None   Collection Time: 08/07/2022  2:30 PM   Specimen: Nasal Mucosa; Nasal Swab  Result Value Ref Range Status   MRSA by PCR Next Gen NOT DETECTED NOT DETECTED Final    Comment: (NOTE) The GeneXpert MRSA Assay (FDA approved for NASAL specimens only), is one component of a comprehensive MRSA colonization surveillance program. It is not intended to diagnose MRSA infection nor to guide or monitor treatment for MRSA infections. Test performance is not FDA approved in patients less than 24 years old. Performed at [facility], [address] [place], [place] [address]      Labs: Basic Metabolic Panel: Recent Labs  Lab 07/26/2022 0211  NA 137  K 4.7  CL 101  CO2 24  GLUCOSE 148*  BUN 30*  CREATININE 1.14*  CALCIUM 9.4   Liver Function Tests: Recent Labs  Lab 07/21/2022 0211  AST 35  ALT 49*  ALKPHOS 94  BILITOT 0.8  PROT 6.4*  ALBUMIN 2.6*   No results for input(s): "LIPASE", "AMYLASE" in the last 168 hours. No results for input(s): "AMMONIA" in the last 168 hours. CBC: Recent Labs  Lab 07/13/2022 0211  WBC 32.8*  NEUTROABS 29.5*  HGB 10.7*  HCT 37.1  MCV 79.6*  PLT 426*   Cardiac Enzymes: No results for input(s): "CKTOTAL", "CKMB", "CKMBINDEX", "TROPONINI" in the last 168 hours.  D-Dimer No  results for input(s): "DDIMER" in the last 72 hours. BNP: Invalid input(s): "POCBNP" CBG: No results for input(s): "GLUCAP" in the last 168 hours. Anemia work up No results for input(s): "VITAMINB12", "FOLATE", "FERRITIN", "TIBC", "IRON", "RETICCTPCT" in the last 72 hours. Urinalysis No results found for: "COLORURINE", "APPEARANCEUR", "LABSPEC", "PHURINE", "GLUCOSEU", "HGBUR", "BILIRUBINUR", "KETONESUR", "PROTEINUR", "UROBILINOGEN", "NITRITE", "LEUKOCYTESUR" Sepsis Labs Recent Labs  Lab 07/27/2022 0211  WBC 32.8*       SIGNED:  Glade Lloyd, MD  [facility] 08/31/2022, 3:16 PM

## 2022-09-11 DEATH — deceased

## 2022-09-30 ENCOUNTER — Encounter: Payer: Medicare Other | Admitting: Internal Medicine
